# Patient Record
Sex: Male | Born: 1940 | Race: White | Hispanic: No | Smoking: Former smoker
Health system: Southern US, Community
[De-identification: ages and names within clinical notes are randomized; demographics above are authoritative.]

## PROBLEM LIST (undated history)

## (undated) DIAGNOSIS — K5790 Diverticulosis of intestine, part unspecified, without perforation or abscess without bleeding: Secondary | ICD-10-CM

## (undated) DIAGNOSIS — R350 Frequency of micturition: Secondary | ICD-10-CM

## (undated) DIAGNOSIS — F99 Mental disorder, not otherwise specified: Secondary | ICD-10-CM

## (undated) DIAGNOSIS — C801 Malignant (primary) neoplasm, unspecified: Secondary | ICD-10-CM

## (undated) DIAGNOSIS — I10 Essential (primary) hypertension: Secondary | ICD-10-CM

## (undated) DIAGNOSIS — K219 Gastro-esophageal reflux disease without esophagitis: Secondary | ICD-10-CM

## (undated) DIAGNOSIS — C4490 Unspecified malignant neoplasm of skin, unspecified: Secondary | ICD-10-CM

## (undated) HISTORY — PX: OTHER SURGICAL HISTORY: SHX169

---

## 2008-07-04 ENCOUNTER — Encounter: Admission: RE | Admit: 2008-07-04 | Discharge: 2008-07-04 | Payer: Self-pay | Admitting: Gastroenterology

## 2009-01-29 ENCOUNTER — Ambulatory Visit: Payer: Self-pay | Admitting: Diagnostic Radiology

## 2009-01-29 ENCOUNTER — Ambulatory Visit (HOSPITAL_BASED_OUTPATIENT_CLINIC_OR_DEPARTMENT_OTHER): Admission: RE | Admit: 2009-01-29 | Discharge: 2009-01-29 | Payer: Self-pay | Admitting: Family Medicine

## 2012-06-26 ENCOUNTER — Inpatient Hospital Stay (HOSPITAL_COMMUNITY)
Admission: EM | Admit: 2012-06-26 | Discharge: 2012-07-01 | DRG: 419 | Disposition: A | Discharge status: Home / Self Care | Payer: Medicare PPO | Attending: Internal Medicine | Admitting: Internal Medicine

## 2012-06-26 ENCOUNTER — Emergency Department (HOSPITAL_COMMUNITY): Payer: Medicare PPO

## 2012-06-26 ENCOUNTER — Encounter (HOSPITAL_COMMUNITY): Payer: Self-pay | Admitting: Emergency Medicine

## 2012-06-26 DIAGNOSIS — K219 Gastro-esophageal reflux disease without esophagitis: Secondary | ICD-10-CM | POA: Diagnosis present

## 2012-06-26 DIAGNOSIS — R7989 Other specified abnormal findings of blood chemistry: Secondary | ICD-10-CM | POA: Diagnosis present

## 2012-06-26 DIAGNOSIS — I10 Essential (primary) hypertension: Secondary | ICD-10-CM | POA: Diagnosis present

## 2012-06-26 DIAGNOSIS — F1010 Alcohol abuse, uncomplicated: Secondary | ICD-10-CM | POA: Diagnosis present

## 2012-06-26 DIAGNOSIS — K8050 Calculus of bile duct without cholangitis or cholecystitis without obstruction: Secondary | ICD-10-CM | POA: Diagnosis present

## 2012-06-26 DIAGNOSIS — K859 Acute pancreatitis without necrosis or infection, unspecified: Principal | ICD-10-CM

## 2012-06-26 DIAGNOSIS — R1013 Epigastric pain: Secondary | ICD-10-CM | POA: Diagnosis present

## 2012-06-26 DIAGNOSIS — K7010 Alcoholic hepatitis without ascites: Secondary | ICD-10-CM | POA: Diagnosis present

## 2012-06-26 DIAGNOSIS — Z87891 Personal history of nicotine dependence: Secondary | ICD-10-CM

## 2012-06-26 DIAGNOSIS — E860 Dehydration: Secondary | ICD-10-CM

## 2012-06-26 HISTORY — DX: Diverticulosis of intestine, part unspecified, without perforation or abscess without bleeding: K57.90

## 2012-06-26 HISTORY — DX: Essential (primary) hypertension: I10

## 2012-06-26 HISTORY — DX: Gastro-esophageal reflux disease without esophagitis: K21.9

## 2012-06-26 LAB — CBC WITH DIFFERENTIAL/PLATELET
Basophils Absolute: 0.1 K/uL (ref 0.0–0.1)
Basophils Relative: 1 % (ref 0–1)
Eosinophils Absolute: 0.5 K/uL (ref 0.0–0.7)
Eosinophils Relative: 5 % (ref 0–5)
HCT: 43.6 % (ref 39.0–52.0)
Hemoglobin: 15 g/dL (ref 13.0–17.0)
Lymphocytes Relative: 21 % (ref 12–46)
Lymphs Abs: 1.8 10*3/uL (ref 0.7–4.0)
MCH: 30.4 pg (ref 26.0–34.0)
MCHC: 34.4 g/dL (ref 30.0–36.0)
MCV: 88.3 fL (ref 78.0–100.0)
Monocytes Absolute: 0.7 10*3/uL (ref 0.1–1.0)
Monocytes Relative: 7 % (ref 3–12)
Neutro Abs: 5.8 10*3/uL (ref 1.7–7.7)
Neutrophils Relative %: 66 % (ref 43–77)
Platelets: 180 K/uL (ref 150–400)
RBC: 4.94 MIL/uL (ref 4.22–5.81)
RDW: 13.2 % (ref 11.5–15.5)
WBC: 8.8 10*3/uL (ref 4.0–10.5)

## 2012-06-26 LAB — COMPREHENSIVE METABOLIC PANEL WITH GFR
ALT: 33 U/L (ref 0–53)
AST: 71 U/L — ABNORMAL HIGH (ref 0–37)
Albumin: 4.2 g/dL (ref 3.5–5.2)
Calcium: 9.5 mg/dL (ref 8.4–10.5)
GFR calc Af Amer: 73 mL/min — ABNORMAL LOW (ref >90)
Sodium: 135 meq/L (ref 135–145)
Total Protein: 7.1 g/dL (ref 6.0–8.3)

## 2012-06-26 LAB — URINALYSIS, ROUTINE W REFLEX MICROSCOPIC
Bilirubin Urine: NEGATIVE
Glucose, UA: NEGATIVE mg/dL
Hgb urine dipstick: NEGATIVE
Ketones, ur: NEGATIVE mg/dL
Leukocytes, UA: NEGATIVE
Nitrite: NEGATIVE
Protein, ur: NEGATIVE mg/dL
Specific Gravity, Urine: 1.013 (ref 1.005–1.030)
Urobilinogen, UA: 0.2 mg/dL (ref 0.0–1.0)
pH: 8 (ref 5.0–8.0)

## 2012-06-26 LAB — COMPREHENSIVE METABOLIC PANEL
Alkaline Phosphatase: 69 U/L (ref 39–117)
BUN: 25 mg/dL — ABNORMAL HIGH (ref 6–23)
CO2: 26 mEq/L (ref 19–32)
Chloride: 99 mEq/L (ref 96–112)
Creatinine, Ser: 1.14 mg/dL (ref 0.50–1.35)
GFR calc non Af Amer: 63 mL/min — ABNORMAL LOW (ref >90)
Glucose, Bld: 137 mg/dL — ABNORMAL HIGH (ref 70–99)
Potassium: 4.3 mEq/L (ref 3.5–5.1)
Total Bilirubin: 1 mg/dL (ref 0.3–1.2)

## 2012-06-26 LAB — POCT I-STAT TROPONIN I: Troponin i, poc: 0 ng/mL (ref 0.00–0.08)

## 2012-06-26 LAB — CK TOTAL AND CKMB (NOT AT ARMC)
CK, MB: 3.4 ng/mL (ref 0.3–4.0)
Relative Index: INVALID (ref 0.0–2.5)
Total CK: 94 U/L (ref 7–232)

## 2012-06-26 LAB — CBC
HCT: 42.6 % (ref 39.0–52.0)
MCHC: 33.8 g/dL (ref 30.0–36.0)
MCV: 88.6 fL (ref 78.0–100.0)
RDW: 13.3 % (ref 11.5–15.5)

## 2012-06-26 LAB — LIPASE, BLOOD: Lipase: >3000 U/L — ABNORMAL HIGH (ref 11–59)

## 2012-06-26 LAB — CREATININE, SERUM: GFR calc Af Amer: >90 mL/min (ref >90)

## 2012-06-26 MED ORDER — ONDANSETRON HCL 4 MG/2ML IJ SOLN
4.0000 mg | Freq: Once | INTRAMUSCULAR | Status: AC
  Administered 2012-06-26: 4 mg via INTRAVENOUS
  Filled 2012-06-26: qty 2

## 2012-06-26 MED ORDER — HYDROMORPHONE HCL PF 1 MG/ML IJ SOLN: 0.5000 mg | INTRAMUSCULAR | Status: DC | PRN

## 2012-06-26 MED ORDER — ONDANSETRON HCL 4 MG/2ML IJ SOLN: 4.0000 mg | Freq: Four times a day (QID) | INTRAMUSCULAR | Status: DC | PRN

## 2012-06-26 MED ORDER — PANTOPRAZOLE SODIUM 40 MG PO TBEC
40.0000 mg | DELAYED_RELEASE_TABLET | Freq: Every day | ORAL | Status: DC
  Administered 2012-06-26 – 2012-07-01 (×6): 40 mg via ORAL
  Filled 2012-06-26 (×6): qty 1

## 2012-06-26 MED ORDER — HYDROMORPHONE HCL PF 1 MG/ML IJ SOLN
1.0000 mg | Freq: Once | INTRAMUSCULAR | Status: AC
  Administered 2012-06-26: 1 mg via INTRAVENOUS
  Filled 2012-06-26: qty 1

## 2012-06-26 MED ORDER — ONDANSETRON HCL 4 MG PO TABS: 4.0000 mg | ORAL_TABLET | Freq: Four times a day (QID) | ORAL | Status: DC | PRN

## 2012-06-26 MED ORDER — POLYETHYLENE GLYCOL 3350 17 G PO PACK
17.0000 g | PACK | Freq: Every day | ORAL | Status: DC | PRN
  Administered 2012-06-26 – 2012-06-27 (×2): 17 g via ORAL
  Filled 2012-06-26 (×2): qty 1

## 2012-06-26 MED ORDER — SODIUM CHLORIDE 0.9 % IV SOLN
Freq: Once | INTRAVENOUS | Status: AC
  Administered 2012-06-26: 12:00:00 via INTRAVENOUS

## 2012-06-26 MED ORDER — THIAMINE HCL 100 MG/ML IJ SOLN
Freq: Once | INTRAVENOUS | Status: AC
  Administered 2012-06-26: 18:00:00 via INTRAVENOUS
  Filled 2012-06-26: qty 1000

## 2012-06-26 MED ORDER — AMLODIPINE BESYLATE 5 MG PO TABS
5.0000 mg | ORAL_TABLET | Freq: Every day | ORAL | Status: DC
  Administered 2012-06-26 – 2012-06-29 (×4): 5 mg via ORAL
  Filled 2012-06-26 (×4): qty 1

## 2012-06-26 MED ORDER — ONDANSETRON HCL 4 MG/2ML IJ SOLN: 4.0000 mg | Freq: Three times a day (TID) | INTRAMUSCULAR | Status: DC | PRN

## 2012-06-26 MED ORDER — HYDROMORPHONE HCL PF 1 MG/ML IJ SOLN: 1.0000 mg | INTRAMUSCULAR | Status: DC | PRN

## 2012-06-26 MED ORDER — FINASTERIDE 5 MG PO TABS
5.0000 mg | ORAL_TABLET | Freq: Every day | ORAL | Status: DC
  Administered 2012-06-26 – 2012-07-01 (×6): 5 mg via ORAL
  Filled 2012-06-26 (×6): qty 1

## 2012-06-26 MED ORDER — HEPARIN SODIUM (PORCINE) 5000 UNIT/ML IJ SOLN
5000.0000 [IU] | Freq: Three times a day (TID) | INTRAMUSCULAR | Status: DC
  Administered 2012-06-26 – 2012-06-29 (×10): 5000 [IU] via SUBCUTANEOUS
  Filled 2012-06-26 (×14): qty 1

## 2012-06-26 MED ORDER — SODIUM CHLORIDE 0.9 % IV SOLN
INTRAVENOUS | Status: AC
  Administered 2012-06-26 – 2012-06-27 (×2): via INTRAVENOUS

## 2012-06-26 NOTE — ED Provider Notes (Signed)
History     CSN: [id number]  Arrival date & time 06/26/12  5284   First MD Initiated Contact with Patient 06/26/12 1033      Chief Complaint  Patient presents with  . Abdominal Pain    (Consider location/radiation/quality/duration/timing/severity/associated sxs/prior treatment) HPI Comments: Pt with rather sudden onset of mid upper epigastric pain earlier this AM, has been present for few hours, not changing much.  Cannot reproduce it.  Some nausea, doesn't radiate to chest, flanks, back.  Pt did not yet eat breakfast this AM, had ribs for dinner in early evening yesterday, did not have alcohol yesterday.  He denies h/o CAD, no prior abd surgeries in the past.  Denies diarrhea, black stools.  He takes omeprazole for GERD, but this feels much worse and different.  Has not taken anything specific for the pain, waited about 30 min, wasn't easing so came to the ED.    Patient is a 71 y.o. male presenting with abdominal pain. The history is provided by the patient.  Abdominal Pain The primary symptoms of the illness include abdominal pain. The primary symptoms of the illness do not include fever, nausea, vomiting or diarrhea.  Symptoms associated with the illness do not include chills, constipation or back pain.    Past Medical History  Diagnosis Date  . Hypertension   . Acid reflux     History reviewed. No pertinent past surgical history.  History reviewed. No pertinent family history.  History  Substance Use Topics  . Smoking status: Former Games developer  . Smokeless tobacco: Not on file  . Alcohol Use: Yes      Review of Systems  Constitutional: Negative for fever and chills.  Cardiovascular: Negative for chest pain.  Gastrointestinal: Positive for abdominal pain. Negative for nausea, vomiting, diarrhea, constipation and blood in stool.  Musculoskeletal: Negative for back pain.  Neurological: Negative for dizziness and light-headedness.  All other systems reviewed and are  negative.    Allergies  Review of patient's allergies indicates no known allergies.  Home Medications   Current Outpatient Rx  Name Route Sig Dispense Refill  . AMLODIPINE BESYLATE 5 MG PO TABS Oral Take 5 mg by mouth daily.    Marland Kitchen DARIFENACIN HYDROBROMIDE ER 7.5 MG PO TB24 Oral Take 7.5 mg by mouth daily.    Marland Kitchen FINASTERIDE 5 MG PO TABS Oral Take 5 mg by mouth daily.    Marland Kitchen GLUCOSAMINE-CHONDROITIN 500-400 MG PO TABS Oral Take 1 tablet by mouth 2 (two) times daily.    Marland Kitchen LOSARTAN POTASSIUM 50 MG PO TABS Oral Take 50 mg by mouth daily.    . ADULT MULTIVITAMIN W/MINERALS CH Oral Take 1 tablet by mouth daily.    Marland Kitchen OMEPRAZOLE 40 MG PO CPDR Oral Take 40 mg by mouth daily.      BP 131/67  Pulse 58  Temp 98 F (36.7 C) (Oral)  Resp 16  SpO2 99%  Physical Exam  Nursing note and vitals reviewed. Constitutional: He is oriented to person, place, and time. He appears well-developed and well-nourished.  Non-toxic appearance. He does not have a sickly appearance. He appears ill. No distress.  HENT:  Head: Normocephalic and atraumatic.  Eyes: Pupils are equal, round, and reactive to light. No scleral icterus.  Neck: Normal range of motion. Neck supple.  Cardiovascular: Regular rhythm.   No murmur heard. Pulmonary/Chest: Effort normal and breath sounds normal. No respiratory distress.  Abdominal: Soft. He exhibits no distension. There is tenderness. There is no rebound and  no guarding.  Neurological: He is alert and oriented to person, place, and time.  Skin: Skin is warm. No rash noted. He is diaphoretic. No pallor.    ED Course  Procedures (including critical care time)  Labs Reviewed  URINALYSIS, ROUTINE W REFLEX MICROSCOPIC - Abnormal; Notable for the following:    APPearance CLOUDY (*)     All other components within normal limits  COMPREHENSIVE METABOLIC PANEL - Abnormal; Notable for the following:    Glucose, Bld 137 (*)     BUN 25 (*)     AST 71 (*)  HEMOLYSIS AT THIS LEVEL MAY  AFFECT RESULT   GFR calc non Af Amer 63 (*)     GFR calc Af Amer 73 (*)     All other components within normal limits  LIPASE, BLOOD - Abnormal; Notable for the following:    Lipase >3000 (*)     All other components within normal limits  CBC WITH DIFFERENTIAL  CK TOTAL AND CKMB  POCT I-STAT TROPONIN I   US Abdomen Complete  06/26/2012  *RADIOLOGY REPORT*  Clinical Data:  Abdominal pain.  ABDOMINAL ULTRASOUND COMPLETE  Comparison:  None.  Findings:  Gallbladder:  Layering sludge seen within the gallbladder, however no discrete gallstones identified.  No evidence of gallbladder wall thickening or pericholecystic fluid.  Common Bile Duct:  Upper limits of normal measuring 6 mm in diameter.  Liver: No focal mass lesion identified.  Within normal limits in parenchymal echogenicity.  IVC:  Appears normal.  Pancreas:  No abnormality identified.  Spleen:  Within normal limits in size and echotexture.  Right kidney:  Normal in size and parenchymal echogenicity.  No evidence of mass or hydronephrosis.  Left kidney:  Normal in size and parenchymal echogenicity.  No evidence of mass or hydronephrosis.Tiny subcapsular cyst seen in the mid pole measuring 1.2 cm.  Abdominal Aorta:  No aneurysm identified.  IMPRESSION:  1.  Gallbladder sludge, withoutevidence of discrete gallstones, gallbladder wall thickening, or biliary dilatation. 2.  Tiny left renal cyst.  No evidence of hydronephrosis.  Original Report Authenticated By: Danae Orleans, M.D.   I reviewed images myself.    1. Pancreatitis, acute   2. Dehydration     12:44 PM Pt feels somewhat improved, spoke to [facility] who will admit to hospital.    MDM  Pt is bradycardic, may be vagal response, or due to his norvasc medication.  Not hypotensive.  Labs and slight elevation in LFT's suggests gallstone pancreatitis.  Sludge present on U/S.  Pt denies alcohol use yesterday.  Given age, pt's morbity and mortality is slightly higher, would recommend admission,  esp if surgical treatment ends up being indicated.          Gavin Pound. Jaeven Wanzer, MD 06/26/12 1247

## 2012-06-26 NOTE — Progress Notes (Signed)
Dustin Kelly [id number] Admission Data: 06/26/2012 4:49 PM Attending Provider: Marinda Elk, MD  WUJ:[id number], Dustin Bussing, MD Consults/ Treatment Team:    Dustin Kelly is a 71 y.o. male patient admitted from ED with pancreatitis awake, alert  & orientated  X 3,  Full Code, VSS - Blood pressure 132/80, pulse 65, temperature 98.7 F (37.1 C), temperature source Oral, resp. rate 16, height 5\' 9"  (1.753 m), weight 90.8 kg (200 lb 2.8 oz), SpO2 96.00%.,  no c/o shortness of breath, no c/o chest pain, no distress noted.   IV site WDL: right ac, condition patent and no redness with a transparent dsg that's clean dry and intact.  Allergies:  No Known Allergies   Past Medical History  Diagnosis Date  . Hypertension   . Acid reflux   . Diverticulosis     History:  obtained from the patient. Tobacco/alcohol: Quit tobacco use 43 years ago 3 liquor drinks per day(s)  Pt orientation to unit, room and routine. Information packet given to patient/family and safety video watched.  Admission INP armband ID verified with patient/family, and in place. SR up x 2, fall risk assessment complete with Patient and family verbalizing understanding of risks associated with falls. Pt verbalizes an understanding of how to use the call bell and to call for help before getting out of bed.  Skin, clean-dry- intact without evidence of bruising, or skin tears.   No evidence of skin break down noted on exam.     Will cont to monitor and assist as needed.  Cindra Eves, RN 06/26/2012 4:49 PM

## 2012-06-26 NOTE — ED Notes (Signed)
Patient transported to Ultrasound 
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
## 2012-06-26 NOTE — ED Notes (Signed)
Pt. Stated, it came in really hard with abd. Pain, no other symptoms

## 2012-06-27 DIAGNOSIS — R52 Pain, unspecified: Secondary | ICD-10-CM

## 2012-06-27 DIAGNOSIS — K811 Chronic cholecystitis: Secondary | ICD-10-CM

## 2012-06-27 DIAGNOSIS — I10 Essential (primary) hypertension: Secondary | ICD-10-CM

## 2012-06-27 LAB — COMPREHENSIVE METABOLIC PANEL
ALT: 174 U/L — ABNORMAL HIGH (ref 0–53)
Albumin: 3.4 g/dL — ABNORMAL LOW (ref 3.5–5.2)
Alkaline Phosphatase: 77 U/L (ref 39–117)
BUN: 25 mg/dL — ABNORMAL HIGH (ref 6–23)
Chloride: 105 mEq/L (ref 96–112)
Glucose, Bld: 94 mg/dL (ref 70–99)
Potassium: 3.5 mEq/L (ref 3.5–5.1)
Total Bilirubin: 1.8 mg/dL — ABNORMAL HIGH (ref 0.3–1.2)

## 2012-06-27 LAB — CBC
MCHC: 33.5 g/dL (ref 30.0–36.0)
MCV: 88.6 fL (ref 78.0–100.0)
Platelets: 150 10*3/uL (ref 150–400)
RDW: 13.5 % (ref 11.5–15.5)
WBC: 9.1 10*3/uL (ref 4.0–10.5)

## 2012-06-27 MED ORDER — SODIUM CHLORIDE 0.9 % IV SOLN
INTRAVENOUS | Status: AC
  Administered 2012-06-27: 12:00:00 via INTRAVENOUS

## 2012-06-27 MED ORDER — BISACODYL 10 MG RE SUPP
10.0000 mg | Freq: Once | RECTAL | Status: AC
  Administered 2012-06-27: 10 mg via RECTAL
  Filled 2012-06-27: qty 1

## 2012-06-27 NOTE — Consult Note (Signed)
I have seen and examined patient.  He does drink alcohol but has sludge in gallbladder concerning for source of pancreatitis.  He is better this am with mild epigastric tenderness.  I recommended lap chole with ioc when pancreatitis resolves prior to discharge. We discussed indication to be prevention of further episodes and doing while here as even in short term he can have another more significant episode.  He is not sure he wants to do this but we will follow daily and continue to recommend.

## 2012-06-27 NOTE — Progress Notes (Signed)
[facility] PROGRESS NOTE  Dustin Kelly ZOX:[id number] DOB: 13-Sep-1941 DOA: 06/26/2012   Assessment/Plan: Patient Active Hospital Problem List: Epigastric abdominal pain (06/26/2012) -patient relates his abdominal pain is improved. Today his LFTs are much higher than yesterday including his bilirubin. There is no further 1 split on his LFTs. On ultrasound the Common Bile Duct: Upper limits of normal measuring 6 mm. -We'll go ahead and consult surgery.   HTN (hypertension) (06/26/2012) -good control, continue amlodipine.  ETOH abuse (06/26/2012) -CIWA score is very low continue monitor no signs of withdrawal.  Elevated LFTs (06/26/2012) -see epigastric abdominal pain   Code Status: Full code  Family Communication: Patient  Disposition Plan: To be determined     LOS: 1 day   Procedures: U/s: Gallbladder sludge, withoutevidence of discrete gallstones, gallbladder wall thickening, or biliary dilatation.    Antibiotics:  none    Subjective: The patient relates his abdominal pain is much better. He did tolerate ice chips overnight.  Objective: Filed Vitals:   06/26/12 1230 06/26/12 1500 06/26/12 2143 06/27/12 0601  BP: 131/67 132/80 175/77 127/64  Pulse: 58 65 61 67  Temp:  98.7 F (37.1 C) 97.7 F (36.5 C) 99.5 F (37.5 C)  TempSrc:  Oral Axillary Oral  Resp:  16 18 17   Height:  5\' 9"  (1.753 m)    Weight:  90.8 kg (200 lb 2.8 oz)  92.851 kg (204 lb 11.2 oz)  SpO2: 99% 96% 95% 96%    Intake/Output Summary (Last 24 hours) at 06/27/12 0817 Last data filed at 06/26/12 1831  Gross per 24 hour  Intake 348.33 ml  Output      0 ml  Net 348.33 ml   Weight change:   Exam:  General: Alert, awake, oriented x3 HEENT: No Jaundice.  Heart: Regular rate and rhythm, without murmurs, rubs, gallops.  Lungs: Good air movement, bilateral air movement.  Abdomen: Soft, nontender, nondistended, positive bowel sounds. Murphy sign negative    Data Reviewed: Basic  Metabolic Panel:  Lab 06/27/12 8119 06/26/12 1546 06/26/12 1029  NA 140 -- 135  K 3.5 -- 4.3  CL 105 -- 99  CO2 26 -- 26  GLUCOSE 94 -- 137*  BUN 25* -- 25*  CREATININE 0.87 0.92 1.14  CALCIUM 8.6 -- 9.5  MG -- -- --  PHOS -- -- --   Liver Function Tests:  Lab 06/27/12 0627 06/26/12 1029  AST 127* 71*  ALT 174* 33  ALKPHOS 77 69  BILITOT 1.8* 1.0  PROT 5.6* 7.1  ALBUMIN 3.4* 4.2    Lab 06/26/12 1059  LIPASE >3000*  AMYLASE --   No results found for this basename: AMMONIA:5 in the last 168 hours CBC:  Lab 06/27/12 0627 06/26/12 1546 06/26/12 1029  WBC 9.1 12.5* 8.8  NEUTROABS -- -- 5.8  HGB 12.7* 14.4 15.0  HCT 37.9* 42.6 43.6  MCV 88.6 88.6 88.3  PLT 150 171 180   Cardiac Enzymes:  Lab 06/26/12 1031  CKTOTAL 94  CKMB 3.4  CKMBINDEX --  TROPONINI --   BNP: No components found with this basename: POCBNP:5 CBG: No results found for this basename: GLUCAP:5 in the last 168 hours  No results found for this or any previous visit (from the past 240 hour(s)).   Studies: US Abdomen Complete  06/26/2012  *RADIOLOGY REPORT*  Clinical Data:  Abdominal pain.  ABDOMINAL ULTRASOUND COMPLETE  Comparison:  None.  Findings:  Gallbladder:  Layering sludge seen within the gallbladder, however no discrete gallstones identified.  No evidence of gallbladder wall thickening or pericholecystic fluid.  Common Bile Duct:  Upper limits of normal measuring 6 mm in diameter.  Liver: No focal mass lesion identified.  Within normal limits in parenchymal echogenicity.  IVC:  Appears normal.  Pancreas:  No abnormality identified.  Spleen:  Within normal limits in size and echotexture.  Right kidney:  Normal in size and parenchymal echogenicity.  No evidence of mass or hydronephrosis.  Left kidney:  Normal in size and parenchymal echogenicity.  No evidence of mass or hydronephrosis.Tiny subcapsular cyst seen in the mid pole measuring 1.2 cm.  Abdominal Aorta:  No aneurysm identified.   IMPRESSION:  1.  Gallbladder sludge, withoutevidence of discrete gallstones, gallbladder wall thickening, or biliary dilatation. 2.  Tiny left renal cyst.  No evidence of hydronephrosis.  Original Report Authenticated By: Danae Orleans, M.D.    Scheduled Meds:   . sodium chloride   Intravenous Once  . sodium chloride   Intravenous STAT  . sodium chloride   Intravenous STAT  . amLODipine  5 mg Oral Daily  . finasteride  5 mg Oral Daily  . heparin  5,000 Units Subcutaneous Q8H  . HYDROmorphone  1 mg Intravenous Once  . ondansetron  4 mg Intravenous Once  . pantoprazole  40 mg Oral Q1200  . general admission iv infusion   Intravenous Once   Continuous Infusions:   Lambert Keto, MD  [facility] Pager [phone]  If 7PM-7AM, please contact night-coverage [email or web address] Password [facility] 06/27/2012, 8:17 AM

## 2012-06-27 NOTE — Care Management Note (Signed)
    Page 1 of 1   07/01/2012     3:34:16 PM   CARE MANAGEMENT NOTE 07/01/2012  Patient:  Dustin Kelly,Dustin Kelly   Account Number:  [id number]  Date Initiated:  06/27/2012  Documentation initiated by:  Letha Cape  Subjective/Objective Assessment:   dx abd pain, pancreatitis  admit- lives alone.     Action/Plan:   ? lap chole  7/18- lap chole   Anticipated DC Date:  07/01/2012   Anticipated DC Plan:  HOME W HOME HEALTH SERVICES      DC Planning Services  CM consult      Choice offered to / List presented to:             Status of service:  Completed, signed off Medicare Important Message given?   (If response is "NO", the following Medicare IM given date fields will be blank) Date Medicare IM given:   Date Additional Medicare IM given:    Discharge Disposition:  HOME/SELF CARE  Per UR Regulation:  Reviewed for med. necessity/level of care/duration of stay  If discussed at Long Length of Stay Meetings, dates discussed:    Comments:  07/01/12 15:33 Letha Cape RN, BSN [phone] patient dc to home today, no needs identified.  06/30/12 12:47 Letha Cape RN, BSN [phone] patient for surgery today, - lap chole.  NCM will continue to follow for dc needs.  06/27/12 16:54 Letha Cape RN, BSN [phone] patient lives alone, pta independent.  Patient has gall bladder pancreatitis, ? for surgery.  NCM will continue to follow for dc needs.

## 2012-06-27 NOTE — Consult Note (Signed)
Reason for Consult:Abdominal pain Referring Physician: Dr. Lenise Arena. Dustin Kelly is an 71 y.o. male.  HPI: This is a 71 year old male with past medical history of hypertension who also drinks every day 3-4 shots of whiskey.  When he doesn't do whiskey he does wine or beer. Comes in for sudden onset of abdominal pain on the morning of admission. He relates this but it out as a 6/10 and went up to a 10 out of 10 in 3-4 hours. No nausea/emesis. Denied binge drinking prior to [employer].     Past Medical History  Diagnosis Date  . Hypertension   . Acid reflux   . Diverticulosis     History reviewed. No pertinent past surgical history.  History reviewed. No pertinent family history.  Social History:  reports that he has quit smoking. His smoking use included Cigarettes. He has a 1.25 pack-year smoking history. He does not have any smokeless tobacco history on file. He reports that he drinks about 2.4 ounces of alcohol per week. He reports that he does not use illicit drugs.  Allergies: No Known Allergies  Medications: I have reviewed the patient's current medications.  Results for orders placed during the hospital encounter of 06/26/12 (from the past 48 hour(s))  URINALYSIS, ROUTINE W REFLEX MICROSCOPIC     Status: Abnormal   Collection Time   06/26/12 10:24 AM      Component Value Range Comment   Color, Urine YELLOW  YELLOW    APPearance CLOUDY (*) CLEAR    Specific Gravity, Urine 1.013  1.005 - 1.030    pH 8.0  5.0 - 8.0    Glucose, UA NEGATIVE  NEGATIVE mg/dL    Hgb urine dipstick NEGATIVE  NEGATIVE    Bilirubin Urine NEGATIVE  NEGATIVE    Ketones, ur NEGATIVE  NEGATIVE mg/dL    Protein, ur NEGATIVE  NEGATIVE mg/dL    Urobilinogen, UA 0.2  0.0 - 1.0 mg/dL    Nitrite NEGATIVE  NEGATIVE    Leukocytes, UA NEGATIVE  NEGATIVE MICROSCOPIC NOT DONE ON URINES WITH NEGATIVE PROTEIN, BLOOD, LEUKOCYTES, NITRITE, OR GLUCOSE <1000 mg/dL.  CBC WITH DIFFERENTIAL     Status: Normal   Collection Time   06/26/12 10:29 AM      Component Value Range Comment   WBC 8.8  4.0 - 10.5 K/uL    RBC 4.94  4.22 - 5.81 MIL/uL    Hemoglobin 15.0  13.0 - 17.0 g/dL    HCT 40.9  81.1 - 91.4 %    MCV 88.3  78.0 - 100.0 fL    MCH 30.4  26.0 - 34.0 pg    MCHC 34.4  30.0 - 36.0 g/dL    RDW 78.2  95.6 - 21.3 %    Platelets 180  150 - 400 K/uL    Neutrophils Relative 66  43 - 77 %    Neutro Abs 5.8  1.7 - 7.7 K/uL    Lymphocytes Relative 21  12 - 46 %    Lymphs Abs 1.8  0.7 - 4.0 K/uL    Monocytes Relative 7  3 - 12 %    Monocytes Absolute 0.7  0.1 - 1.0 K/uL    Eosinophils Relative 5  0 - 5 %    Eosinophils Absolute 0.5  0.0 - 0.7 K/uL    Basophils Relative 1  0 - 1 %    Basophils Absolute 0.1  0.0 - 0.1 K/uL   COMPREHENSIVE METABOLIC PANEL  Status: Abnormal   Collection Time   06/26/12 10:29 AM      Component Value Range Comment   Sodium 135  135 - 145 mEq/L    Potassium 4.3  3.5 - 5.1 mEq/L    Chloride 99  96 - 112 mEq/L    CO2 26  19 - 32 mEq/L    Glucose, Bld 137 (*) 70 - 99 mg/dL    BUN 25 (*) 6 - 23 mg/dL    Creatinine, Ser 6.57  0.50 - 1.35 mg/dL    Calcium 9.5  8.4 - 84.6 mg/dL    Total Protein 7.1  6.0 - 8.3 g/dL    Albumin 4.2  3.5 - 5.2 g/dL    AST 71 (*) 0 - 37 U/L HEMOLYSIS AT THIS LEVEL MAY AFFECT RESULT   ALT 33  0 - 53 U/L    Alkaline Phosphatase 69  39 - 117 U/L    Total Bilirubin 1.0  0.3 - 1.2 mg/dL    GFR calc non Af Amer 63 (*) >90 mL/min    GFR calc Af Amer 73 (*) >90 mL/min   CK TOTAL AND CKMB     Status: Normal   Collection Time   06/26/12 10:31 AM      Component Value Range Comment   Total CK 94  7 - 232 U/L    CK, MB 3.4  0.3 - 4.0 ng/mL    Relative Index RELATIVE INDEX IS INVALID  0.0 - 2.5   POCT I-STAT TROPONIN I     Status: Normal   Collection Time   06/26/12 10:37 AM      Component Value Range Comment   Troponin i, poc 0.00  0.00 - 0.08 ng/mL    Comment 3            LIPASE, BLOOD     Status: Abnormal   Collection Time   06/26/12 10:59  AM      Component Value Range Comment   Lipase >3000 (*) 11 - 59 U/L   CBC     Status: Abnormal   Collection Time   06/26/12  3:46 PM      Component Value Range Comment   WBC 12.5 (*) 4.0 - 10.5 K/uL    RBC 4.81  4.22 - 5.81 MIL/uL    Hemoglobin 14.4  13.0 - 17.0 g/dL    HCT 96.2  95.2 - 84.1 %    MCV 88.6  78.0 - 100.0 fL    MCH 29.9  26.0 - 34.0 pg    MCHC 33.8  30.0 - 36.0 g/dL    RDW 32.4  40.1 - 02.7 %    Platelets 171  150 - 400 K/uL   CREATININE, SERUM     Status: Abnormal   Collection Time   06/26/12  3:46 PM      Component Value Range Comment   Creatinine, Ser 0.92  0.50 - 1.35 mg/dL    GFR calc non Af Amer 83 (*) >90 mL/min    GFR calc Af Amer >90  >90 mL/min   TSH     Status: Normal   Collection Time   06/26/12  3:46 PM      Component Value Range Comment   TSH 0.655  0.350 - 4.500 uIU/mL   COMPREHENSIVE METABOLIC PANEL     Status: Abnormal   Collection Time   06/27/12  6:27 AM      Component Value Range Comment   Sodium 140  135 - 145 mEq/L    Potassium 3.5  3.5 - 5.1 mEq/L    Chloride 105  96 - 112 mEq/L    CO2 26  19 - 32 mEq/L    Glucose, Bld 94  70 - 99 mg/dL    BUN 25 (*) 6 - 23 mg/dL    Creatinine, Ser 1.61  0.50 - 1.35 mg/dL    Calcium 8.6  8.4 - 09.6 mg/dL    Total Protein 5.6 (*) 6.0 - 8.3 g/dL    Albumin 3.4 (*) 3.5 - 5.2 g/dL    AST 045 (*) 0 - 37 U/L    ALT 174 (*) 0 - 53 U/L    Alkaline Phosphatase 77  39 - 117 U/L    Total Bilirubin 1.8 (*) 0.3 - 1.2 mg/dL    GFR calc non Af Amer 85 (*) >90 mL/min    GFR calc Af Amer >90  >90 mL/min   CBC     Status: Abnormal   Collection Time   06/27/12  6:27 AM      Component Value Range Comment   WBC 9.1  4.0 - 10.5 K/uL    RBC 4.28  4.22 - 5.81 MIL/uL    Hemoglobin 12.7 (*) 13.0 - 17.0 g/dL    HCT 40.9 (*) 81.1 - 52.0 %    MCV 88.6  78.0 - 100.0 fL    MCH 29.7  26.0 - 34.0 pg    MCHC 33.5  30.0 - 36.0 g/dL    RDW 91.4  78.2 - 95.6 %    Platelets 150  150 - 400 K/uL   PROTIME-INR     Status: Normal     Collection Time   06/27/12  6:27 AM      Component Value Range Comment   Prothrombin Time 14.9  11.6 - 15.2 seconds    INR 1.15  0.00 - 1.49     US Abdomen Complete  06/26/2012  *RADIOLOGY REPORT*  Clinical Data:  Abdominal pain.  ABDOMINAL ULTRASOUND COMPLETE  Comparison:  None.  Findings:  Gallbladder:  Layering sludge seen within the gallbladder, however no discrete gallstones identified.  No evidence of gallbladder wall thickening or pericholecystic fluid.  Common Bile Duct:  Upper limits of normal measuring 6 mm in diameter.  Liver: No focal mass lesion identified.  Within normal limits in parenchymal echogenicity.  IVC:  Appears normal.  Pancreas:  No abnormality identified.  Spleen:  Within normal limits in size and echotexture.  Right kidney:  Normal in size and parenchymal echogenicity.  No evidence of mass or hydronephrosis.  Left kidney:  Normal in size and parenchymal echogenicity.  No evidence of mass or hydronephrosis.Tiny subcapsular cyst seen in the mid pole measuring 1.2 cm.  Abdominal Aorta:  No aneurysm identified.  IMPRESSION:  1.  Gallbladder sludge, withoutevidence of discrete gallstones, gallbladder wall thickening, or biliary dilatation. 2.  Tiny left renal cyst.  No evidence of hydronephrosis.  Original Report Authenticated By: Danae Orleans, M.D.    Review of Systems  Constitutional: Negative.   HENT: Negative.   Eyes: Negative.   Respiratory: Negative.   Cardiovascular: Negative.   Gastrointestinal: Positive for abdominal pain. Negative for heartburn, nausea, vomiting, diarrhea, constipation, blood in stool and melena.  Genitourinary: Negative.   Musculoskeletal: Negative.   Skin: Negative.   Neurological: Negative.   Endo/Heme/Allergies: Negative.   Psychiatric/Behavioral: Negative.    Blood pressure 127/64, pulse 67, temperature 99.5 F (37.5 C), temperature source Oral,  resp. rate 17, height 5\' 9"  (1.753 m), weight 204 lb 11.2 oz (92.851 kg), SpO2  96.00%. Physical Exam  Constitutional: He is oriented to person, place, and time. He appears well-developed and well-nourished.  HENT:  Head: Normocephalic and atraumatic.  Mouth/Throat: No oropharyngeal exudate.  Eyes: Conjunctivae are normal. Pupils are equal, round, and reactive to light. Right eye exhibits no discharge. Left eye exhibits no discharge. No scleral icterus.  Neck: Normal range of motion. Neck supple. No JVD present. No tracheal deviation present. No thyromegaly present.  Cardiovascular: Normal rate and regular rhythm.  Exam reveals no gallop and no friction rub.   No murmur heard. Respiratory: Effort normal and breath sounds normal. No stridor. No respiratory distress. He has no wheezes. He has no rales. He exhibits no tenderness.  GI: Soft. Bowel sounds are normal. He exhibits no distension and no mass. There is tenderness. There is no rebound and no guarding.  Musculoskeletal: Normal range of motion. He exhibits no edema and no tenderness.  Lymphadenopathy:    He has no cervical adenopathy.  Neurological: He is alert and oriented to person, place, and time.  Skin: Skin is warm and dry. No rash noted. No erythema. No pallor.  Psychiatric: He has a normal mood and affect.    Assessment/Plan: 1. Cholelithiasis vs pancreatitis (Gallbladder: Layering sludge seen within the gallbladder, however  no discrete gallstones identified. No evidence of gallbladder wall  thickening or pericholecystic fluid.  Common Bile Duct: Upper limits of normal measuring 6 mm in  diameter.  Liver: No focal mass lesion identified. Within normal limits in  parenchymal echogenicity). 2. Remain NPO 3. Continue to monitor WBC, LFT's  4. Probable cholysestectomy in the next few days once LFTs are wnl.  Laure Leone 06/27/2012, 10:04 AM

## 2012-06-28 DIAGNOSIS — K859 Acute pancreatitis without necrosis or infection, unspecified: Principal | ICD-10-CM

## 2012-06-28 LAB — COMPREHENSIVE METABOLIC PANEL
AST: 54 U/L — ABNORMAL HIGH (ref 0–37)
Albumin: 3.7 g/dL (ref 3.5–5.2)
Alkaline Phosphatase: 79 U/L (ref 39–117)
BUN: 16 mg/dL (ref 6–23)
Calcium: 9.1 mg/dL (ref 8.4–10.5)
Creatinine, Ser: 0.76 mg/dL (ref 0.50–1.35)
Glucose, Bld: 92 mg/dL (ref 70–99)
Sodium: 141 mEq/L (ref 135–145)
Total Bilirubin: 2.8 mg/dL — ABNORMAL HIGH (ref 0.3–1.2)

## 2012-06-28 LAB — CBC
HCT: 40.6 % (ref 39.0–52.0)
MCH: 29.8 pg (ref 26.0–34.0)
MCHC: 33.7 g/dL (ref 30.0–36.0)
MCV: 88.5 fL (ref 78.0–100.0)
RDW: 13.4 % (ref 11.5–15.5)

## 2012-06-28 MED ORDER — POTASSIUM CHLORIDE 10 MEQ/100ML IV SOLN
10.0000 meq | INTRAVENOUS | Status: AC
  Administered 2012-06-28 (×2): 10 meq via INTRAVENOUS
  Filled 2012-06-28 (×2): qty 100

## 2012-06-28 MED ORDER — POTASSIUM CHLORIDE 10 MEQ/100ML IV SOLN
10.0000 meq | INTRAVENOUS | Status: AC
  Administered 2012-06-28: 10 meq via INTRAVENOUS
  Filled 2012-06-28 (×3): qty 100

## 2012-06-28 MED ORDER — SODIUM CHLORIDE 0.9 % IV SOLN
INTRAVENOUS | Status: DC
  Administered 2012-06-28 (×2): via INTRAVENOUS

## 2012-06-28 NOTE — Consult Note (Signed)
Referring Provider: Dr. Marinda Elk Primary Care Physician:  Sid Falcon, MD Primary Gastroenterologist:  Dr. Evette Cristal  Reason for Consultation:  Pancreatitis,? CBD stone  HPI: Dustin Kelly is a 71 y.o. male admitted to the hospital 2 days ago following the abrupt onset of severe epigastric pain, not associated with fevers, nausea, vomiting, or radiation to the back. Evaluation in the emergency room showed a lipase over 3000, an ultrasound showed a 6 mm duct with sludge in the gallbladder. He had never had any previous similar attacks. The patient drinks regularly and fairly heavily, probably 6 ounces of whiskey a day, but there have been no binge drinking prior to this episode, nor any prior history of pancreatitis. Since admission, the patient has had a rapid decline in his lipase level and is essentially pain-free at this time. He is not eating, but he is tolerating clear liquids without any nausea, vomiting, or exacerbation of symptoms. His liver chemistries were mildly elevated on admission, with transaminases in the 100-200 range, total bilirubin 1.8. Since admission, the transaminases have fallen about 50% but the bilirubin has risen from 1.8 to 2.8, so we were asked to see the patient with respect to possible need for ERCP.   Past Medical History  Diagnosis Date  . Hypertension   . Acid reflux   . Diverticulosis     History reviewed. No pertinent past surgical history.  Prior to Admission medications   Medication Sig Start Date End Date Taking? Authorizing Provider  amLODipine (NORVASC) 5 MG tablet Take 5 mg by mouth daily.   Yes Historical Provider, MD  darifenacin (ENABLEX) 7.5 MG 24 hr tablet Take 7.5 mg by mouth daily.   Yes Historical Provider, MD  finasteride (PROSCAR) 5 MG tablet Take 5 mg by mouth daily.   Yes Historical Provider, MD  glucosamine-chondroitin 500-400 MG tablet Take 1 tablet by mouth 2 (two) times daily.   Yes Historical Provider, MD  losartan  (COZAAR) 50 MG tablet Take 50 mg by mouth daily.   Yes Historical Provider, MD  Multiple Vitamin (MULTIVITAMIN WITH MINERALS) TABS Take 1 tablet by mouth daily.   Yes Historical Provider, MD  omeprazole (PRILOSEC) 40 MG capsule Take 40 mg by mouth daily.   Yes Historical Provider, MD    Current Facility-Administered Medications  Medication Dose Route Frequency Provider Last Rate Last Dose  . 0.9 %  sodium chloride infusion   Intravenous STAT Rolan Lipa, NP 100 mL/hr at 06/27/12 1148    . 0.9 %  sodium chloride infusion   Intravenous Continuous Marinda Elk, MD 10 mL/hr at 06/28/12 1049    . amLODipine (NORVASC) tablet 5 mg  5 mg Oral Daily Marinda Elk, MD   5 mg at 06/28/12 0907  . bisacodyl (DULCOLAX) suppository 10 mg  10 mg Rectal Once Caroline More, NP   10 mg at 06/27/12 2107  . finasteride (PROSCAR) tablet 5 mg  5 mg Oral Daily Marinda Elk, MD   5 mg at 06/28/12 0907  . heparin injection 5,000 Units  5,000 Units Subcutaneous Q8H Marinda Elk, MD   5,000 Units at 06/28/12 1322  . HYDROmorphone (DILAUDID) injection 1 mg  1 mg Intravenous Q2H PRN Marinda Elk, MD      . ondansetron [facility]) tablet 4 mg  4 mg Oral Q6H PRN Marinda Elk, MD       Or  . ondansetron [facility]) injection 4 mg  4 mg Intravenous Q6H PRN Darin Engels  David Stall, MD      . pantoprazole (PROTONIX) EC tablet 40 mg  40 mg Oral Q1200 Marinda Elk, MD   40 mg at 06/28/12 1100  . polyethylene glycol (MIRALAX / GLYCOLAX) packet 17 g  17 g Oral Daily PRN Marinda Elk, MD   17 g at 06/27/12 1700  . potassium chloride 10 mEq in 100 mL IVPB  10 mEq Intravenous Q1 Hr x 3 Marinda Elk, MD   10 mEq at 06/28/12 1049  . potassium chloride 10 mEq in 100 mL IVPB  10 mEq Intravenous Q1 Hr x 2 Marinda Elk, MD   10 mEq at 06/28/12 1257    Allergies as of 06/26/2012  . (No Known Allergies)    History reviewed. No pertinent family history.  History    Social History  . Marital Status: Divorced    Spouse Name: N/A    Number of Children: N/A  . Years of Education: N/A   Occupational History  . Retired Arboriculturist   Social History Main Topics  . Smoking status: Former Smoker -- 0.2 packs/day for 5 years    Types: Cigarettes  . Smokeless tobacco: Not on file  . Alcohol Use: 2.4 oz/week    4 Shots of liquor per week  . Drug Use: No  . Sexually Active: No   Other Topics Concern  . Not on file   Social History Narrative  . No narrative on file    Review of Systems:  See history of present illness  Physical Exam: Vital signs in last 24 hours: Temp:  [97.9 F (36.6 C)-100.4 F (38 C)] 97.9 F (36.6 C) (07/16 1323) Pulse Rate:  [63-76] 67  (07/16 1323) Resp:  [18-20] 18  (07/16 1323) BP: (158-189)/(80-88) 160/80 mmHg (07/16 1323) SpO2:  [93 %-97 %] 97 % (07/16 1323) Weight:  [90.8 kg (200 lb 2.8 oz)] 90.8 kg (200 lb 2.8 oz) (07/16 0536) Last BM Date: 06/28/12 General:   Alert,  Well-developed, well-nourished, pleasant and cooperative in NAD Head:  Normocephalic and atraumatic. Eyes:  Sclera clear, no icterus.   Lungs:  Clear throughout to auscultation.   No wheezes, crackles, or rhonchi. No evident respiratory distress. Heart:   Regular rate and rhythm; no murmurs, clicks, rubs,  or gallops. Abdomen:  Soft, nontender, nontympanitic, and nondistended. No masses, hepatosplenomegaly or ventral hernias noted. Normal bowel sounds, without bruits, guarding, or rebound.   Msk:   Symmetrical without gross deformities. Extremities:   Without clubbing, cyanosis. Neurologic:  Alert and coherent;  grossly normal neurologically. Psych:   Alert and cooperative. Normal mood and affect.  Intake/Output from previous day: 07/15 0701 - 07/16 0700 In: 1633.3 [I.V.:1633.3] Out: -  Intake/Output this shift:    Lab Results:  Basename 06/28/12 0557 06/27/12 0627 06/26/12 1546  WBC 8.0 9.1 12.5*  HGB 13.7 12.7* 14.4  HCT  40.6 37.9* 42.6  PLT 160 150 171   BMET  Basename 06/28/12 0557 06/27/12 0627 06/26/12 1546 06/26/12 1029  NA 141 140 -- 135  K 3.3* 3.5 -- 4.3  CL 105 105 -- 99  CO2 22 26 -- 26  GLUCOSE 92 94 -- 137*  BUN 16 25* -- 25*  CREATININE 0.76 0.87 0.92 --  CALCIUM 9.1 8.6 -- 9.5   LFT  Basename 06/28/12 0557  PROT 6.6  ALBUMIN 3.7  AST 54*  ALT 113*  ALKPHOS 79  BILITOT 2.8*  BILIDIR --  IBILI --   PT/INR  Basename 06/27/12 0627  LABPROT 14.9  INR 1.15    Studies/Results: No results found.  Impression: Rapidly resolving pancreatitis. Given the elevated liver chemistries, the rapid resolution of pancreatitis, and the patient's overall clinical picture, I think this is most likely biliary pancreatitis and I agree with the surgeons plan for cholecystectomy this admission. I doubt this pancreatitis is due to alcohol because there is been no recent increase in his alcohol consumption, and in my experience, one does not usually see such a high elevation of lipase, plus a rapid normalization, as we have seen in this patient, when the pancreatitis is due to alcohol. Pancreatitis due to other causes, such as malignancy or hypertriglyceridemia, also seems unlikely.  Plan: Await labs tomorrow. I will check a direct bilirubin to see if the mild elevation of bilirubin we're seeing could be due to Gilbert's disease. I will also check a triglyceride level. If his bilirubin continues to rise but his other numbers look good, we might consider an MRCP in preference to an ERCP, to reduce risk. On the other hand, if his bilirubin is improved tomorrow, I would agree with Dr. Doreen Salvage plan to proceed to cholecystectomy.   In case ERCP is needed at some point, I have reviewed the nature, purpose, and risks of the procedure with the patient. Blind passage of the scope into the patient's esophagus would have to be done carefully because of his history of a Zenker's diverticulum, although his most  recent barium swallow on the record, performed 4 years ago, showed only a tiny recurrent diverticulum..   LOS: 2 days   Zasha Belleau V  06/28/2012, 2:46 PM

## 2012-06-28 NOTE — Progress Notes (Signed)
Feels well, nontender abdomen, lipase returning to normal, I think pancreatitis has resolved but bilirubin up now, would ask gi to see.  If bilirubin is rising tomorrow then I think ercp would be initial test.  If coming down then will plan lap chole tomorrow, will make npo after mn

## 2012-06-28 NOTE — Progress Notes (Signed)
  Subjective: Feels good this morning, no c/o n/v, +BM this am +BS "Would like something to eat."  Objective: Vital signs in last 24 hours: Temp:  [98.8 F (37.1 C)-100.4 F (38 C)] 99.1 F (37.3 C) (07/16 0536) Pulse Rate:  [63-76] 76  (07/16 0536) Resp:  [18-20] 18  (07/16 0536) BP: (158-189)/(76-88) 163/86 mmHg (07/16 0536) SpO2:  [93 %-96 %] 93 % (07/16 0536) Weight:  [200 lb 2.8 oz (90.8 kg)] 200 lb 2.8 oz (90.8 kg) (07/16 0536) Last BM Date: 06/28/12  Intake/Output from previous day: 07/15 0701 - 07/16 0700 In: 1633.3 [I.V.:1633.3] Out: -  Intake/Output this shift:    General appearance: alert, cooperative, appears stated age and no distress Abdomen: No bloating, +BS, + BM   Lipase now at 241 (was > 3000) all other labs trending towards normal. Remains afebrile.  Lab Results:   Basename 06/28/12 0557 06/27/12 0627  WBC 8.0 9.1  HGB 13.7 12.7*  HCT 40.6 37.9*  PLT 160 150   BMET  Basename 06/28/12 0557 06/27/12 0627  NA 141 140  K 3.3* 3.5  CL 105 105  CO2 22 26  GLUCOSE 92 94  BUN 16 25*  CREATININE 0.76 0.87  CALCIUM 9.1 8.6   PT/INR  Basename 06/27/12 0627  LABPROT 14.9  INR 1.15   ABG No results found for this basename: PHART:2,PCO2:2,PO2:2,HCO3:2 in the last 72 hours  Studies/Results: US Abdomen Complete  06/26/2012  *RADIOLOGY REPORT*  Clinical Data:  Abdominal pain.  ABDOMINAL ULTRASOUND COMPLETE  Comparison:  None.  Findings:  Gallbladder:  Layering sludge seen within the gallbladder, however no discrete gallstones identified.  No evidence of gallbladder wall thickening or pericholecystic fluid.  Common Bile Duct:  Upper limits of normal measuring 6 mm in diameter.  Liver: No focal mass lesion identified.  Within normal limits in parenchymal echogenicity.  IVC:  Appears normal.  Pancreas:  No abnormality identified.  Spleen:  Within normal limits in size and echotexture.  Right kidney:  Normal in size and parenchymal echogenicity.  No evidence  of mass or hydronephrosis.  Left kidney:  Normal in size and parenchymal echogenicity.  No evidence of mass or hydronephrosis.Tiny subcapsular cyst seen in the mid pole measuring 1.2 cm.  Abdominal Aorta:  No aneurysm identified.  IMPRESSION:  1.  Gallbladder sludge, withoutevidence of discrete gallstones, gallbladder wall thickening, or biliary dilatation. 2.  Tiny left renal cyst.  No evidence of hydronephrosis.  Original Report Authenticated By: Danae Orleans, M.D.    Anti-infectives: Anti-infectives    None      Assessment/Plan: s/p * No surgery found * 1. Advance to clear liquid diet. 2. Possible lap chole, soon; will await Dr. Doreen Salvage discussion with patient concerning this issue.  LOS: 2 days    Dustin Kelly 06/28/2012

## 2012-06-28 NOTE — Progress Notes (Signed)
[facility] PROGRESS NOTE  Dustin Kelly YNW:[id number] DOB: 05/18/1941 DOA: 06/26/2012   Assessment/Plan: Patient Active Hospital Problem List: Epigastric abdominal pain (06/26/2012) -patient relates his abdominal pain is improved. Today his LFTs are trending down but his bilirubin is trending up. We'll go ahead and get a GI consult for further evaluation to -Appriciate surgery assistance laparoscopic cholecystectomy soon.   HTN (hypertension) (06/26/2012) -good control, continue amlodipine.  ETOH abuse (06/26/2012) -CIWA score is very low continue monitor no signs of withdrawal.  Elevated LFTs (06/26/2012) -see epigastric abdominal pain   Code Status: Full code  Family Communication: Patient  Disposition Plan: To be determined     LOS: 2 days   Procedures: U/s: Gallbladder sludge, withoutevidence of discrete gallstones, gallbladder wall thickening, or biliary dilatation.    Antibiotics:  none    Subjective: The patient relates his abdominal pain is much better. He did tolerate ice chips overnight.  Objective: Filed Vitals:   06/27/12 1300 06/27/12 2058 06/27/12 2224 06/28/12 0536  BP: 158/76 189/80 158/88 163/86  Pulse: 65 63  76  Temp: 98.8 F (37.1 C) 100.4 F (38 C)  99.1 F (37.3 C)  TempSrc: Oral Oral  Oral  Resp: 18 20  18   Height:      Weight:    90.8 kg (200 lb 2.8 oz)  SpO2: 93% 96%  93%    Intake/Output Summary (Last 24 hours) at 06/28/12 1016 Last data filed at 06/27/12 1828  Gross per 24 hour  Intake 1633.33 ml  Output      0 ml  Net 1633.33 ml   Weight change: 0 kg (0 lb)  Exam:  General: Alert, awake, oriented x3 HEENT: No Jaundice.  Heart: Regular rate and rhythm, without murmurs, rubs, gallops.  Lungs: Good air movement, bilateral air movement.  Abdomen: Soft, nontender, nondistended, positive bowel sounds. Murphy sign negative    Data Reviewed: Basic Metabolic Panel:  Lab 06/28/12 6578 06/27/12 0627 06/26/12 1546  06/26/12 1029  NA 141 140 -- 135  K 3.3* 3.5 -- --  CL 105 105 -- 99  CO2 22 26 -- 26  GLUCOSE 92 94 -- 137*  BUN 16 25* -- 25*  CREATININE 0.76 0.87 0.92 1.14  CALCIUM 9.1 8.6 -- 9.5  MG -- -- -- --  PHOS -- -- -- --   Liver Function Tests:  Lab 06/28/12 0557 06/27/12 0627 06/26/12 1029  AST 54* 127* 71*  ALT 113* 174* 33  ALKPHOS 79 77 69  BILITOT 2.8* 1.8* 1.0  PROT 6.6 5.6* 7.1  ALBUMIN 3.7 3.4* 4.2    Lab 06/28/12 0557 06/26/12 1059  LIPASE 241* >3000*  AMYLASE -- --   No results found for this basename: AMMONIA:5 in the last 168 hours CBC:  Lab 06/28/12 0557 06/27/12 0627 06/26/12 1546 06/26/12 1029  WBC 8.0 9.1 12.5* 8.8  NEUTROABS -- -- -- 5.8  HGB 13.7 12.7* 14.4 15.0  HCT 40.6 37.9* 42.6 43.6  MCV 88.5 88.6 88.6 88.3  PLT 160 150 171 180   Cardiac Enzymes:  Lab 06/26/12 1031  CKTOTAL 94  CKMB 3.4  CKMBINDEX --  TROPONINI --   BNP: No components found with this basename: POCBNP:5 CBG: No results found for this basename: GLUCAP:5 in the last 168 hours  No results found for this or any previous visit (from the past 240 hour(s)).   Studies: US Abdomen Complete  06/26/2012  *RADIOLOGY REPORT*  Clinical Data:  Abdominal pain.  ABDOMINAL ULTRASOUND COMPLETE  Comparison:  None.  Findings:  Gallbladder:  Layering sludge seen within the gallbladder, however no discrete gallstones identified.  No evidence of gallbladder wall thickening or pericholecystic fluid.  Common Bile Duct:  Upper limits of normal measuring 6 mm in diameter.  Liver: No focal mass lesion identified.  Within normal limits in parenchymal echogenicity.  IVC:  Appears normal.  Pancreas:  No abnormality identified.  Spleen:  Within normal limits in size and echotexture.  Right kidney:  Normal in size and parenchymal echogenicity.  No evidence of mass or hydronephrosis.  Left kidney:  Normal in size and parenchymal echogenicity.  No evidence of mass or hydronephrosis.Tiny subcapsular cyst seen in  the mid pole measuring 1.2 cm.  Abdominal Aorta:  No aneurysm identified.  IMPRESSION:  1.  Gallbladder sludge, withoutevidence of discrete gallstones, gallbladder wall thickening, or biliary dilatation. 2.  Tiny left renal cyst.  No evidence of hydronephrosis.  Original Report Authenticated By: Danae Orleans, M.D.    Scheduled Meds:    . sodium chloride   Intravenous STAT  . sodium chloride   Intravenous STAT  . amLODipine  5 mg Oral Daily  . bisacodyl  10 mg Rectal Once  . finasteride  5 mg Oral Daily  . heparin  5,000 Units Subcutaneous Q8H  . pantoprazole  40 mg Oral Q1200  . potassium chloride  10 mEq Intravenous Q1 Hr x 3   Continuous Infusions:    . sodium chloride 75 mL/hr at 06/28/12 0544    Lambert Keto, MD  [facility] Pager [phone]  If 7PM-7AM, please contact night-coverage [email or web address] Password [email or web address] 06/28/2012, 10:16 AM

## 2012-06-28 NOTE — Progress Notes (Signed)
PT had an elevated bp of 169/93. RN notified MD on call. RN will continue to monitor pt

## 2012-06-29 LAB — CBC WITH DIFFERENTIAL/PLATELET
HCT: 37.9 % — ABNORMAL LOW (ref 39.0–52.0)
Hemoglobin: 13 g/dL (ref 13.0–17.0)
Lymphs Abs: 1.9 10*3/uL (ref 0.7–4.0)
MCH: 30.4 pg (ref 26.0–34.0)
Monocytes Relative: 11 % (ref 3–12)
Neutro Abs: 4.3 10*3/uL (ref 1.7–7.7)
Neutrophils Relative %: 58 % (ref 43–77)
RBC: 4.27 MIL/uL (ref 4.22–5.81)

## 2012-06-29 LAB — COMPREHENSIVE METABOLIC PANEL
ALT: 74 U/L — ABNORMAL HIGH (ref 0–53)
Albumin: 3.5 g/dL (ref 3.5–5.2)
Calcium: 9.2 mg/dL (ref 8.4–10.5)
GFR calc Af Amer: >90 mL/min (ref >90)
Glucose, Bld: 102 mg/dL — ABNORMAL HIGH (ref 70–99)
Sodium: 140 mEq/L (ref 135–145)
Total Protein: 6.4 g/dL (ref 6.0–8.3)

## 2012-06-29 LAB — TRIGLYCERIDES: Triglycerides: 76 mg/dL (ref <150)

## 2012-06-29 LAB — SURGICAL PCR SCREEN: MRSA, PCR: NEGATIVE

## 2012-06-29 MED ORDER — AMLODIPINE BESYLATE 10 MG PO TABS
10.0000 mg | ORAL_TABLET | Freq: Every day | ORAL | Status: DC
  Administered 2012-06-30 – 2012-07-01 (×2): 10 mg via ORAL
  Filled 2012-06-29 (×2): qty 1

## 2012-06-29 MED ORDER — POTASSIUM CHLORIDE 10 MEQ/100ML IV SOLN
10.0000 meq | INTRAVENOUS | Status: AC
  Administered 2012-06-29: 10 meq via INTRAVENOUS
  Filled 2012-06-29 (×2): qty 100

## 2012-06-29 MED ORDER — MUPIROCIN 2 % EX OINT
1.0000 "application " | TOPICAL_OINTMENT | Freq: Two times a day (BID) | CUTANEOUS | Status: DC
  Administered 2012-06-29 – 2012-06-30 (×4): 1 via NASAL
  Filled 2012-06-29: qty 22

## 2012-06-29 MED ORDER — CHLORHEXIDINE GLUCONATE CLOTH 2 % EX PADS
6.0000 | MEDICATED_PAD | Freq: Every day | CUTANEOUS | Status: DC
  Administered 2012-06-29 – 2012-06-30 (×2): 6 via TOPICAL

## 2012-06-29 NOTE — Progress Notes (Signed)
PATIENT DETAILS Name: Dustin Kelly Age: 71 y.o. Sex: male Date of Birth: September 25, 1941 Admit Date: 06/26/2012 [id number], Nolon Bussing, MD  Subjective: Feels good-no abd pain-cholecystectomy postponed for 7/18  Objective: Vital signs in last 24 hours: Filed Vitals:   06/28/12 2131 06/28/12 2252 06/29/12 0500 06/29/12 0537  BP: 169/93 161/90  154/84  Pulse: 73   70  Temp: 100.8 F (38.2 C)   98.5 F (36.9 C)  TempSrc: Oral   Oral  Resp: 18   18  Height:      Weight:   89.1 kg (196 lb 6.9 oz)   SpO2: 96%   96%    Assessment/Plan: Principal Problem:  *Abdominal pain, acute, epigastric -2/2 Gallstone pancreatitis -Lap Cholecystectomy tomorrow  Active Problems:  HTN (hypertension) -moderate control with amlodipine-will increase to 10 mg/day -resume Losartan at discharge   ETOH abuse -awake and alert-no tremors -monitor   Elevated LFTs -2/2 Gallstone pancreatitis and ? ETOH hepatitis-now downtrending -monitor LFT's  GERD -PPI  Disposition: -remain inpatient-home after Lap Cholecystectomy  DVT Prophylaxis: -SQ Heparin  Code Status: Full Code   Intake/Output from previous day:  Intake/Output Summary (Last 24 hours) at 06/29/12 1021 Last data filed at 06/29/12 0600  Gross per 24 hour  Intake 813.08 ml  Output      0 ml  Net 813.08 ml    PHYSICAL EXAM: Weight change: -1.7 kg (-3 lb 12 oz) Body mass index is 29.01 kg/(m^2).  Gen Exam: Awake and alert with clear speech.  Neck: Supple, No JVD.   Chest: B/L Clear.   CVS: S1 S2 Regular, no murmurs.  Abdomen: soft, BS +, non tender, non distended.  Extremities: no edema, lower extremities warm to touch. Neurologic: Non Focal.   Skin: No Rash.   Wounds: N/A.    CONSULTS:  GI and general surgery  LAB RESULTS: CBC  Lab 06/29/12 0616 06/28/12 0557 06/27/12 0627 06/26/12 1546 06/26/12 1029  WBC 7.4 8.0 9.1 12.5* 8.8  HGB 13.0 13.7 12.7* 14.4 15.0  HCT 37.9* 40.6 37.9* 42.6 43.6  PLT 153 160 150 171  180  MCV 88.8 88.5 88.6 88.6 88.3  MCH 30.4 29.8 29.7 29.9 30.4  MCHC 34.3 33.7 33.5 33.8 34.4  RDW 13.3 13.4 13.5 13.3 13.2  LYMPHSABS 1.9 -- -- -- 1.8  MONOABS 0.8 -- -- -- 0.7  EOSABS 0.3 -- -- -- 0.5  BASOSABS 0.0 -- -- -- 0.1  BANDABS -- -- -- -- --    Chemistries   Lab 06/29/12 0616 06/28/12 0557 06/27/12 0627 06/26/12 1546 06/26/12 1029  NA 140 141 140 -- 135  K 3.1* 3.3* 3.5 -- 4.3  CL 105 105 105 -- 99  CO2 26 22 26  -- 26  GLUCOSE 102* 92 94 -- 137*  BUN 10 16 25* -- 25*  CREATININE 0.85 0.76 0.87 0.92 1.14  CALCIUM 9.2 9.1 8.6 -- 9.5  MG -- -- -- -- --    CBG: No results found for this basename: GLUCAP:5 in the last 168 hours  GFR Estimated Creatinine Clearance: 89.3 ml/min (by C-G formula based on Cr of 0.85).  Coagulation profile  Lab 06/27/12 0627  INR 1.15  PROTIME --    Cardiac Enzymes  Lab 06/26/12 1031  CKMB 3.4  TROPONINI --  MYOGLOBIN --    No components found with this basename: POCBNP:3 No results found for this basename: DDIMER:2 in the last 72 hours No results found for this basename: HGBA1C:2 in the last 72 hours  Basename 06/29/12 0616  CHOL --  HDL --  LDLCALC --  TRIG 76  CHOLHDL --  LDLDIRECT --    Basename 06/26/12 1546  TSH 0.655  T4TOTAL --  T3FREE --  THYROIDAB --   No results found for this basename: VITAMINB12:2,FOLATE:2,FERRITIN:2,TIBC:2,IRON:2,RETICCTPCT:2 in the last 72 hours  Basename 06/29/12 0616 06/28/12 0557  LIPASE 124* 241*  AMYLASE -- --    Urine Studies No results found for this basename: UACOL:2,UAPR:2,USPG:2,UPH:2,UTP:2,UGL:2,UKET:2,UBIL:2,UHGB:2,UNIT:2,UROB:2,ULEU:2,UEPI:2,UWBC:2,URBC:2,UBAC:2,CAST:2,CRYS:2,UCOM:2,BILUA:2 in the last 72 hours  MICROBIOLOGY: Recent Results (from the past 240 hour(s))  SURGICAL PCR SCREEN     Status: Abnormal   Collection Time   06/29/12  6:25 AM      Component Value Range Status Comment   MRSA, PCR NEGATIVE  NEGATIVE Final    Staphylococcus aureus  POSITIVE (*) NEGATIVE Final     RADIOLOGY STUDIES/RESULTS: US Abdomen Complete  06/26/2012  *RADIOLOGY REPORT*  Clinical Data:  Abdominal pain.  ABDOMINAL ULTRASOUND COMPLETE  Comparison:  None.  Findings:  Gallbladder:  Layering sludge seen within the gallbladder, however no discrete gallstones identified.  No evidence of gallbladder wall thickening or pericholecystic fluid.  Common Bile Duct:  Upper limits of normal measuring 6 mm in diameter.  Liver: No focal mass lesion identified.  Within normal limits in parenchymal echogenicity.  IVC:  Appears normal.  Pancreas:  No abnormality identified.  Spleen:  Within normal limits in size and echotexture.  Right kidney:  Normal in size and parenchymal echogenicity.  No evidence of mass or hydronephrosis.  Left kidney:  Normal in size and parenchymal echogenicity.  No evidence of mass or hydronephrosis.Tiny subcapsular cyst seen in the mid pole measuring 1.2 cm.  Abdominal Aorta:  No aneurysm identified.  IMPRESSION:  1.  Gallbladder sludge, withoutevidence of discrete gallstones, gallbladder wall thickening, or biliary dilatation. 2.  Tiny left renal cyst.  No evidence of hydronephrosis.  Original Report Authenticated By: Danae Orleans, M.D.    MEDICATIONS: Scheduled Meds:   . amLODipine  5 mg Oral Daily  . finasteride  5 mg Oral Daily  . heparin  5,000 Units Subcutaneous Q8H  . pantoprazole  40 mg Oral Q1200  . potassium chloride  10 mEq Intravenous Q1 Hr x 3  . potassium chloride  10 mEq Intravenous Q1 Hr x 2  . potassium chloride  10 mEq Intravenous Q1 Hr x 2   Continuous Infusions:   . sodium chloride 20 mL/hr at 06/29/12 0600   PRN Meds:.HYDROmorphone (DILAUDID) injection, ondansetron (ZOFRAN) IV, ondansetron, polyethylene glycol  Antibiotics: Anti-infectives    None       Jeoffrey Massed, MD  [facility] Pager [phone]  If 7PM-7AM, please contact night-coverage [email or web address] Password [email or web address] 06/29/2012, 10:21  AM   LOS: 3 days

## 2012-06-29 NOTE — Progress Notes (Signed)
  Subjective: Feels fine, hungry  Objective: Vital signs in last 24 hours: Temp:  [97.9 F (36.6 C)-100.8 F (38.2 C)] 98.5 F (36.9 C) (07/17 0537) Pulse Rate:  [67-73] 70  (07/17 0537) Resp:  [18] 18  (07/17 0537) BP: (154-169)/(80-93) 154/84 mmHg (07/17 0537) SpO2:  [96 %-97 %] 96 % (07/17 0537) Weight:  [196 lb 6.9 oz (89.1 kg)] 196 lb 6.9 oz (89.1 kg) (07/17 0500) Last BM Date: 06/28/12  Intake/Output from previous day: 07/16 0701 - 07/17 0700 In: 813.1 [P.O.:240; I.V.:573.1] Out: -  Intake/Output this shift:    General appearance: no distress GI: nontender  Lab Results:   Basename 06/29/12 0616 06/28/12 0557  WBC 7.4 8.0  HGB 13.0 13.7  HCT 37.9* 40.6  PLT 153 160   BMET  Basename 06/29/12 0616 06/28/12 0557  NA 140 141  K 3.1* 3.3*  CL 105 105  CO2 26 22  GLUCOSE 102* 92  BUN 10 16  CREATININE 0.85 0.76  CALCIUM 9.2 9.1   PT/INR  Basename 06/27/12 0627  LABPROT 14.9  INR 1.15   ABG No results found for this basename: PHART:2,PCO2:2,PO2:2,HCO3:2 in the last 72 hours  Studies/Results: No results found.  Anti-infectives: Anti-infectives    None      Assessment/Plan: GSP  Pancreatitis clinically resolved, bilirubin decreased a little today, I think best plan would be to proceed with lap chole pending schedule for today.  He is agreeable and we discussed cholangiogram and possible postop ercp. I discussed the procedure in detail.  TWe discussed the risks and benefits of a laparoscopic cholecystectomy and possible cholangiogram including, but not limited to bleeding, infection, injury to surrounding structures such as the intestine or liver, bile leak, retained gallstones, need to convert to an open procedure, prolonged diarrhea,  common bile duct injury, and possible need for additional procedures.  The likelihood of improvement in symptoms and return to the patient's normal status is good. We discussed the typical post-operative recovery  course.    LOS: 3 days    [facility] 06/29/2012

## 2012-06-29 NOTE — Progress Notes (Signed)
Surgical reviewed, and discussed case with Dr. Dwain Sarna.  Patient's labs have all improved. Lipase is almost down to normal, transaminases show further improvement, and even his bilirubin is starting to come down slightly.   Of note, his hyperbilirubinemia is primarily unconjugated (that is, his direct bilirubin was less than half of his total bilirubin). This is compatible with Gilbert's syndrome, which I explained to the patient and discussed with Dr. Dwain Sarna.  Based on these findings, I agree with Dr. Doreen Salvage plan to proceed to cholecystectomy, rather than having the patient undergo further preoperative testing of his common bile duct.  I will sign off at this time, but we will remain on standby, so please call us if cholangiographic findings suggest the need for postoperative evaluation such as ERCP.  Florencia Reasons, M.D. [phone]

## 2012-06-29 NOTE — Progress Notes (Signed)
I told patient I have three other emergencies today that will last all day.  We discussed lap chole first thing tomorrow morning.  Will let him have full liquids until mn

## 2012-06-30 ENCOUNTER — Inpatient Hospital Stay (HOSPITAL_COMMUNITY): Payer: Medicare PPO | Admitting: Critical Care Medicine

## 2012-06-30 ENCOUNTER — Encounter (HOSPITAL_COMMUNITY): Payer: Self-pay | Admitting: Critical Care Medicine

## 2012-06-30 ENCOUNTER — Inpatient Hospital Stay (HOSPITAL_COMMUNITY): Payer: Medicare PPO

## 2012-06-30 ENCOUNTER — Encounter (HOSPITAL_COMMUNITY)
Admission: EM | Disposition: A | Discharge status: Home / Self Care | Payer: Self-pay | Source: Home / Self Care | Attending: Internal Medicine

## 2012-06-30 HISTORY — PX: CHOLECYSTECTOMY: SHX55

## 2012-06-30 SURGERY — LAPAROSCOPIC CHOLECYSTECTOMY WITH INTRAOPERATIVE CHOLANGIOGRAM
Anesthesia: General | Site: Abdomen | Wound class: Contaminated

## 2012-06-30 MED ORDER — SODIUM CHLORIDE 0.9 % IR SOLN
Status: DC | PRN
  Administered 2012-06-30 (×2): 1000 mL

## 2012-06-30 MED ORDER — FENTANYL CITRATE 0.05 MG/ML IJ SOLN
INTRAMUSCULAR | Status: DC | PRN
  Administered 2012-06-30: 100 ug via INTRAVENOUS
  Administered 2012-06-30 (×3): 50 ug via INTRAVENOUS

## 2012-06-30 MED ORDER — SODIUM CHLORIDE 0.9 % IV SOLN
INTRAVENOUS | Status: DC
  Administered 2012-06-30: 15:00:00 via INTRAVENOUS

## 2012-06-30 MED ORDER — 0.9 % SODIUM CHLORIDE (POUR BTL) OPTIME
TOPICAL | Status: DC | PRN
  Administered 2012-06-30: 1000 mL

## 2012-06-30 MED ORDER — ONDANSETRON HCL 4 MG/2ML IJ SOLN
INTRAMUSCULAR | Status: DC | PRN
  Administered 2012-06-30: 4 mg via INTRAVENOUS

## 2012-06-30 MED ORDER — SODIUM CHLORIDE 0.9 % IV SOLN
INTRAVENOUS | Status: DC | PRN
  Administered 2012-06-30: 07:00:00

## 2012-06-30 MED ORDER — MIDAZOLAM HCL 5 MG/5ML IJ SOLN
INTRAMUSCULAR | Status: DC | PRN
  Administered 2012-06-30: 2 mg via INTRAVENOUS

## 2012-06-30 MED ORDER — LIDOCAINE HCL (CARDIAC) 20 MG/ML IV SOLN
INTRAVENOUS | Status: DC | PRN
  Administered 2012-06-30: 50 mg via INTRAVENOUS

## 2012-06-30 MED ORDER — PROPOFOL 10 MG/ML IV EMUL
INTRAVENOUS | Status: DC | PRN
  Administered 2012-06-30: 150 mg via INTRAVENOUS

## 2012-06-30 MED ORDER — KETOROLAC TROMETHAMINE 30 MG/ML IJ SOLN
15.0000 mg | Freq: Once | INTRAMUSCULAR | Status: AC | PRN
  Administered 2012-06-30: 30 mg via INTRAVENOUS

## 2012-06-30 MED ORDER — BUPIVACAINE-EPINEPHRINE 0.25% -1:200000 IJ SOLN
INTRAMUSCULAR | Status: DC | PRN
  Administered 2012-06-30: 10 mL

## 2012-06-30 MED ORDER — OXYCODONE HCL 5 MG PO TABS: 5.0000 mg | ORAL_TABLET | ORAL | Status: DC | PRN

## 2012-06-30 MED ORDER — CEFAZOLIN SODIUM-DEXTROSE 2-3 GM-% IV SOLR
INTRAVENOUS | Status: AC
  Filled 2012-06-30: qty 50

## 2012-06-30 MED ORDER — HYDROMORPHONE HCL PF 1 MG/ML IJ SOLN
INTRAMUSCULAR | Status: AC
  Administered 2012-06-30: 0.5 mg
  Administered 2012-06-30: 0.5 mg via INTRAVENOUS
  Filled 2012-06-30: qty 1

## 2012-06-30 MED ORDER — MEPERIDINE HCL 25 MG/ML IJ SOLN: 6.2500 mg | INTRAMUSCULAR | Status: DC | PRN

## 2012-06-30 MED ORDER — DEXAMETHASONE SODIUM PHOSPHATE 4 MG/ML IJ SOLN
INTRAMUSCULAR | Status: DC | PRN
  Administered 2012-06-30: 4 mg via INTRAVENOUS

## 2012-06-30 MED ORDER — ROCURONIUM BROMIDE 100 MG/10ML IV SOLN
INTRAVENOUS | Status: DC | PRN
  Administered 2012-06-30: 50 mg via INTRAVENOUS

## 2012-06-30 MED ORDER — NEOSTIGMINE METHYLSULFATE 1 MG/ML IJ SOLN
INTRAMUSCULAR | Status: DC | PRN
  Administered 2012-06-30: 4 mg via INTRAVENOUS

## 2012-06-30 MED ORDER — EPHEDRINE SULFATE 50 MG/ML IJ SOLN
INTRAMUSCULAR | Status: DC | PRN
  Administered 2012-06-30: 10 mg via INTRAVENOUS

## 2012-06-30 MED ORDER — PROMETHAZINE HCL 25 MG/ML IJ SOLN: 6.2500 mg | INTRAMUSCULAR | Status: DC | PRN

## 2012-06-30 MED ORDER — CEFAZOLIN SODIUM 1-5 GM-% IV SOLN
INTRAVENOUS | Status: DC | PRN
  Administered 2012-06-30: 2 g via INTRAVENOUS

## 2012-06-30 MED ORDER — LACTATED RINGERS IV SOLN
INTRAVENOUS | Status: DC | PRN
  Administered 2012-06-30 (×2): via INTRAVENOUS

## 2012-06-30 MED ORDER — BUPIVACAINE-EPINEPHRINE PF 0.25-1:200000 % IJ SOLN
INTRAMUSCULAR | Status: AC
  Filled 2012-06-30: qty 30

## 2012-06-30 MED ORDER — HEMOSTATIC AGENTS (NO CHARGE) OPTIME
TOPICAL | Status: DC | PRN
  Administered 2012-06-30 (×2): 1 via TOPICAL

## 2012-06-30 MED ORDER — ACETAMINOPHEN 650 MG RE SUPP: 650.0000 mg | Freq: Four times a day (QID) | RECTAL | Status: DC | PRN

## 2012-06-30 MED ORDER — ACETAMINOPHEN 325 MG PO TABS: 650.0000 mg | ORAL_TABLET | Freq: Four times a day (QID) | ORAL | Status: DC | PRN

## 2012-06-30 MED ORDER — GLYCOPYRROLATE 0.2 MG/ML IJ SOLN
INTRAMUSCULAR | Status: DC | PRN
  Administered 2012-06-30: 0.6 mg via INTRAVENOUS

## 2012-06-30 MED ORDER — OXYCODONE-ACETAMINOPHEN 5-325 MG PO TABS
ORAL_TABLET | ORAL | Status: AC
  Filled 2012-06-30: qty 2

## 2012-06-30 SURGICAL SUPPLY — 40 items
APPLIER CLIP 5 13 M/L LIGAMAX5 (MISCELLANEOUS) ×2
BLADE SURG ROTATE 9660 (MISCELLANEOUS) IMPLANT
CANISTER SUCTION 2500CC (MISCELLANEOUS) ×2 IMPLANT
CHLORAPREP W/TINT 26ML (MISCELLANEOUS) ×2 IMPLANT
CLIP APPLIE 5 13 M/L LIGAMAX5 (MISCELLANEOUS) ×1 IMPLANT
CLOTH BEACON ORANGE TIMEOUT ST (SAFETY) ×2 IMPLANT
COVER MAYO STAND STRL (DRAPES) ×2 IMPLANT
COVER SURGICAL LIGHT HANDLE (MISCELLANEOUS) ×2 IMPLANT
DECANTER SPIKE VIAL GLASS SM (MISCELLANEOUS) ×4 IMPLANT
DERMABOND ADVANCED (GAUZE/BANDAGES/DRESSINGS) ×1
DERMABOND ADVANCED .7 DNX12 (GAUZE/BANDAGES/DRESSINGS) ×1 IMPLANT
DRAPE C-ARM 42X72 X-RAY (DRAPES) ×2 IMPLANT
ELECT REM PT RETURN 9FT ADLT (ELECTROSURGICAL) ×2
ELECTRODE REM PT RTRN 9FT ADLT (ELECTROSURGICAL) ×1 IMPLANT
GLOVE BIO SURGEON STRL SZ7 (GLOVE) ×4 IMPLANT
GLOVE BIO SURGEON STRL SZ7.5 (GLOVE) ×2 IMPLANT
GLOVE BIOGEL PI IND STRL 7.0 (GLOVE) ×1 IMPLANT
GLOVE BIOGEL PI IND STRL 7.5 (GLOVE) ×2 IMPLANT
GLOVE BIOGEL PI INDICATOR 7.0 (GLOVE) ×1
GLOVE BIOGEL PI INDICATOR 7.5 (GLOVE) ×2
GOWN STRL NON-REIN LRG LVL3 (GOWN DISPOSABLE) ×6 IMPLANT
HEMOSTAT SNOW SURGICEL 2X4 (HEMOSTASIS) ×4 IMPLANT
KIT BASIN OR (CUSTOM PROCEDURE TRAY) ×2 IMPLANT
KIT ROOM TURNOVER OR (KITS) ×2 IMPLANT
NS IRRIG 1000ML POUR BTL (IV SOLUTION) ×2 IMPLANT
PAD ARMBOARD 7.5X6 YLW CONV (MISCELLANEOUS) ×2 IMPLANT
POUCH SPECIMEN RETRIEVAL 10MM (ENDOMECHANICALS) ×2 IMPLANT
SCISSORS LAP 5X35 DISP (ENDOMECHANICALS) ×2 IMPLANT
SET CHOLANGIOGRAPH 5 50 .035 (SET/KITS/TRAYS/PACK) ×2 IMPLANT
SET IRRIG TUBING LAPAROSCOPIC (IRRIGATION / IRRIGATOR) ×2 IMPLANT
SLEEVE ENDOPATH XCEL 5M (ENDOMECHANICALS) ×4 IMPLANT
SPECIMEN JAR MEDIUM (MISCELLANEOUS) ×2 IMPLANT
SPECIMEN JAR SMALL (MISCELLANEOUS) IMPLANT
SUT MNCRL AB 4-0 PS2 18 (SUTURE) ×2 IMPLANT
SUT VICRYL 0 UR6 27IN ABS (SUTURE) ×2 IMPLANT
TOWEL OR 17X24 6PK STRL BLUE (TOWEL DISPOSABLE) IMPLANT
TOWEL OR 17X26 10 PK STRL BLUE (TOWEL DISPOSABLE) ×2 IMPLANT
TRAY LAPAROSCOPIC (CUSTOM PROCEDURE TRAY) ×2 IMPLANT
TROCAR XCEL BLUNT TIP 100MML (ENDOMECHANICALS) ×2 IMPLANT
TROCAR XCEL NON-BLD 5MMX100MML (ENDOMECHANICALS) ×2 IMPLANT

## 2012-06-30 NOTE — Transfer of Care (Signed)
Immediate Anesthesia Transfer of Care Note  Patient: Dustin Kelly  Procedure(s) Performed: Procedure(s) (LRB): LAPAROSCOPIC CHOLECYSTECTOMY WITH INTRAOPERATIVE CHOLANGIOGRAM (N/A)  Patient Location: PACU  Anesthesia Type: General  Level of Consciousness: awake, alert  and oriented  Airway & Oxygen Therapy: Patient Spontanous Breathing and Patient connected to nasal cannula oxygen  Post-op Assessment: Report given to PACU RN, Post -op Vital signs reviewed and stable and Patient moving all extremities X 4  Post vital signs: Reviewed and stable  Complications: No apparent anesthesia complications

## 2012-06-30 NOTE — Progress Notes (Signed)
PATIENT DETAILS Name: Dustin Kelly Age: 71 y.o. Sex: male Date of Birth: 1941-11-13 Admit Date: 06/26/2012 [id number], Nolon Bussing, MD  Subjective: S/P cholecystectomy-mild pain  Objective: Vital signs in last 24 hours: Filed Vitals:   06/30/12 1054 06/30/12 1057 06/30/12 1127 06/30/12 1317  BP:  144/81 132/83 120/87  Pulse:  88 78 102  Temp: 97.2 F (36.2 C)  98.1 F (36.7 C) 97.4 F (36.3 C)  TempSrc:   Oral Axillary  Resp:  14 15 16   Height:      Weight:      SpO2:  96% 96% 97%    Assessment/Plan: Principal Problem:  *Abdominal pain, acute, epigastric -2/2 Gallstone pancreatitis -S/P Lap Cholecystectomy today-already on clear liquids  Active Problems:  HTN (hypertension) -moderate control with amlodipine-increased to 10 mg/day on 7/18 -resume Losartan at discharge   ETOH abuse -awake and alert-no tremors -monitor   Elevated LFTs -2/2 Gallstone pancreatitis and ? ETOH hepatitis-now downtrending -monitor LFT's  GERD -PPI  Disposition: -remain inpatient-home hopefully on 7/18  DVT Prophylaxis: -SCD's  Code Status: Full Code   Intake/Output from previous day:  Intake/Output Summary (Last 24 hours) at 06/30/12 1324 Last data filed at 06/30/12 1317  Gross per 24 hour  Intake 3280.33 ml  Output     50 ml  Net 3230.33 ml    PHYSICAL EXAM: Weight change: -1 kg (-2 lb 3.3 oz) Body mass index is 28.68 kg/(m^2).  Gen Exam: Awake and alert with clear speech.  Neck: Supple, No JVD.   Chest: B/L Clear.   CVS: S1 S2 Regular, no murmurs.  Abdomen: soft, BS +, non tender, non distended.  Extremities: no edema, lower extremities warm to touch. Neurologic: Non Focal.   Skin: No Rash.   Wounds: N/A.    CONSULTS:  GI and general surgery  LAB RESULTS: CBC  Lab 06/29/12 0616 06/28/12 0557 06/27/12 0627 06/26/12 1546 06/26/12 1029  WBC 7.4 8.0 9.1 12.5* 8.8  HGB 13.0 13.7 12.7* 14.4 15.0  HCT 37.9* 40.6 37.9* 42.6 43.6  PLT 153 160 150 171 180    MCV 88.8 88.5 88.6 88.6 88.3  MCH 30.4 29.8 29.7 29.9 30.4  MCHC 34.3 33.7 33.5 33.8 34.4  RDW 13.3 13.4 13.5 13.3 13.2  LYMPHSABS 1.9 -- -- -- 1.8  MONOABS 0.8 -- -- -- 0.7  EOSABS 0.3 -- -- -- 0.5  BASOSABS 0.0 -- -- -- 0.1  BANDABS -- -- -- -- --    Chemistries   Lab 06/29/12 0616 06/28/12 0557 06/27/12 0627 06/26/12 1546 06/26/12 1029  NA 140 141 140 -- 135  K 3.1* 3.3* 3.5 -- 4.3  CL 105 105 105 -- 99  CO2 26 22 26  -- 26  GLUCOSE 102* 92 94 -- 137*  BUN 10 16 25* -- 25*  CREATININE 0.85 0.76 0.87 0.92 1.14  CALCIUM 9.2 9.1 8.6 -- 9.5  MG -- -- -- -- --    CBG: No results found for this basename: GLUCAP:5 in the last 168 hours  GFR Estimated Creatinine Clearance: 88.9 ml/min (by C-G formula based on Cr of 0.85).  Coagulation profile  Lab 06/27/12 0627  INR 1.15  PROTIME --    Cardiac Enzymes  Lab 06/26/12 1031  CKMB 3.4  TROPONINI --  MYOGLOBIN --    No components found with this basename: POCBNP:3 No results found for this basename: DDIMER:2 in the last 72 hours No results found for this basename: HGBA1C:2 in the last 72 hours  Basename 06/29/12  4010  CHOL --  HDL --  LDLCALC --  TRIG 76  CHOLHDL --  LDLDIRECT --   No results found for this basename: TSH,T4TOTAL,FREET3,T3FREE,THYROIDAB in the last 72 hours No results found for this basename: VITAMINB12:2,FOLATE:2,FERRITIN:2,TIBC:2,IRON:2,RETICCTPCT:2 in the last 72 hours  Basename 06/29/12 0616 06/28/12 0557  LIPASE 124* 241*  AMYLASE -- --    Urine Studies No results found for this basename: UACOL:2,UAPR:2,USPG:2,UPH:2,UTP:2,UGL:2,UKET:2,UBIL:2,UHGB:2,UNIT:2,UROB:2,ULEU:2,UEPI:2,UWBC:2,URBC:2,UBAC:2,CAST:2,CRYS:2,UCOM:2,BILUA:2 in the last 72 hours  MICROBIOLOGY: Recent Results (from the past 240 hour(s))  SURGICAL PCR SCREEN     Status: Abnormal   Collection Time   06/29/12  6:25 AM      Component Value Range Status Comment   MRSA, PCR NEGATIVE  NEGATIVE Final    Staphylococcus  aureus POSITIVE (*) NEGATIVE Final     RADIOLOGY STUDIES/RESULTS: US Abdomen Complete  06/26/2012  *RADIOLOGY REPORT*  Clinical Data:  Abdominal pain.  ABDOMINAL ULTRASOUND COMPLETE  Comparison:  None.  Findings:  Gallbladder:  Layering sludge seen within the gallbladder, however no discrete gallstones identified.  No evidence of gallbladder wall thickening or pericholecystic fluid.  Common Bile Duct:  Upper limits of normal measuring 6 mm in diameter.  Liver: No focal mass lesion identified.  Within normal limits in parenchymal echogenicity.  IVC:  Appears normal.  Pancreas:  No abnormality identified.  Spleen:  Within normal limits in size and echotexture.  Right kidney:  Normal in size and parenchymal echogenicity.  No evidence of mass or hydronephrosis.  Left kidney:  Normal in size and parenchymal echogenicity.  No evidence of mass or hydronephrosis.Tiny subcapsular cyst seen in the mid pole measuring 1.2 cm.  Abdominal Aorta:  No aneurysm identified.  IMPRESSION:  1.  Gallbladder sludge, withoutevidence of discrete gallstones, gallbladder wall thickening, or biliary dilatation. 2.  Tiny left renal cyst.  No evidence of hydronephrosis.  Original Report Authenticated By: Danae Orleans, M.D.    MEDICATIONS: Scheduled Meds:    . amLODipine  10 mg Oral Daily  . Chlorhexidine Gluconate Cloth  6 each Topical Daily  . finasteride  5 mg Oral Daily  . HYDROmorphone      . mupirocin ointment  1 application Nasal BID  . pantoprazole  40 mg Oral Q1200  . DISCONTD: heparin  5,000 Units Subcutaneous Q8H   Continuous Infusions:    . sodium chloride    . DISCONTD: sodium chloride 20 mL/hr at 06/30/12 0631   PRN Meds:.acetaminophen, acetaminophen, HYDROmorphone (DILAUDID) injection, ketorolac, meperidine (DEMEROL) injection, ondansetron (ZOFRAN) IV, ondansetron, oxyCODONE, promethazine, DISCONTD: 0.9 % irrigation (POUR BTL), DISCONTD: bupivacaine-EPINEPHrine, DISCONTD: hemostatic agents, DISCONTD:  Omnipaque 300 mg/mL (50 mL) in 0.9% normal saline (50 mL), DISCONTD: polyethylene glycol, DISCONTD: sodium chloride irrigation  Antibiotics: Anti-infectives    None       Jeoffrey Massed, MD  [facility] Pager [phone]  If 7PM-7AM, please contact night-coverage [email or web address] Password [email or web address] 06/30/2012, 1:24 PM   LOS: 4 days

## 2012-06-30 NOTE — Op Note (Signed)
Preoperative diagnosis: Gallstone pancreatitis Postoperative diagnosis: Same as above, chronic cholecystitis Procedure: Laparoscopic cholecystectomy with cholangiogram Surgeon: Dr. Harden Mo Anesthesia: Gen. Specimens: Gallbladder and contents to pathology Estimated blood loss: 50 cc Complications: None Drains: None Sponge and needle count was correct x2 at operation Disposition to recovery stable  Indications: This is a 71 year old male who has a recent bout of pancreatitis in the hospital and underwent an ultrasound showing cholelithiasis. His pancreatitis has resolved and he and I discussed a laparoscopic cholecystectomy with the risks and benefits prior to his discharge.  Procedure: After informed consent was obtained the patient was taken to the operating room. He was given 2 g of intravenous cefazolin. Sequential compression devices were placed on his legs. He was then placed under general anesthesia without complication. His abdomen was prepped and draped in the standard sterile surgical fashion. A surgical timeout was then performed.  I infiltrated quarter percent Marcaine below his umbilicus. I then made a vertical incision and identified his fascia. This was entered sharply and the peritoneum was entered bluntly. I placed a 0 Vicryl pursestring suture through the fascia. I then inserted a Hassan trocar and insufflated his abdomen to 15 mm mercury pressure. I then inserted 3 further 5 mm trocars in the epigastrium and right upper quadrant. These were done under direct vision without complication. I then addressed his gallbladder. This was noted to be tense. I then decompressed the gallbladder and evacuated  bile so I could grasp it. I then retracted the gallbladder cephalad and lateral. With some difficulty due to a lot of chronic inflammation I dissected his triangle was clearly able to see both the cystic duct and the cystic artery. I then clipped the cystic duct distally. I  introduced a Cook catheter into the abdomen. I made a ductotomy and introduced the catheter into the cystic duct. This was clipped into position. A cholangiogram was then performed showing that I was in the cystic duct, filling of both sides of the liver, no stones in the duct, and filling of the duodenum. I then removed the catheter and clipped the duct. I then divided this. I treated the artery as well as a small branch of the artery a similar fashion. The gallbladder was then removed from the hepatic bed with some difficulty as there was no real plane due to chronic inflammation. Eventually this was removed with no entrance into the gallbladder. This was then placed in an Endo Catch bag and removed from the umbilicus. I then took a fair amount of time obtaining hemostasis in the liver bed. I then placed 2 pieces of Surgicel in the liver bed. Irrigation was performed until this was clear. I then removed the [facility] and trocar tied down my suture. I placed one additional figure-of-eight 0 Vicryl suture and completely obliterated this defect. I then desufflated the abdomen removed all trocars. I then closed the incisions with 4 Monocryl and Dermabond. He tolerated was extubated and transferred to recovery stable.

## 2012-06-30 NOTE — Preoperative (Signed)
Beta Blockers   Reason not to administer Beta Blockers:Not Applicable 

## 2012-06-30 NOTE — Anesthesia Procedure Notes (Signed)
Procedure Name: Intubation Date/Time: 06/30/2012 7:40 AM Performed by: Elon Alas Pre-anesthesia Checklist: Patient identified, Timeout performed, Emergency Drugs available, Suction available and Patient being monitored Patient Re-evaluated:Patient Re-evaluated prior to inductionOxygen Delivery Method: Circle system utilized Preoxygenation: Pre-oxygenation with 100% oxygen Intubation Type: IV induction Ventilation: Mask ventilation without difficulty Laryngoscope Size: Mac and 4 Grade View: Grade I Tube type: Oral Tube size: 7.5 mm Number of attempts: 1 Airway Equipment and Method: Stylet Placement Confirmation: positive ETCO2,  ETT inserted through vocal cords under direct vision and breath sounds checked- equal and bilateral Secured at: 23 cm Tube secured with: Tape Dental Injury: Teeth and Oropharynx as per pre-operative assessment

## 2012-06-30 NOTE — Anesthesia Preprocedure Evaluation (Addendum)
Anesthesia Evaluation  Patient identified by MRN, date of birth, ID band Patient awake    Reviewed: Allergy & Precautions, H&P , NPO status , Patient's Chart, lab work & pertinent test results  Airway Mallampati: II TM Distance: >3 FB Neck ROM: Full    Dental  (+) Dental Advisory Given and Teeth Intact   Pulmonary former smoker,  breath sounds clear to auscultation        Cardiovascular hypertension, Pt. on medications Rhythm:Regular Rate:Normal     Neuro/Psych    GI/Hepatic GERD-  Medicated,(+)     substance abuse  alcohol use, zenkers diverticulum   Endo/Other    Renal/GU      Musculoskeletal   Abdominal   Peds  Hematology   Anesthesia Other Findings   Reproductive/Obstetrics                        Anesthesia Physical Anesthesia Plan  ASA: II  Anesthesia Plan: General   Post-op Pain Management:    Induction: Intravenous  Airway Management Planned: Oral ETT  Additional Equipment:   Intra-op Plan:   Post-operative Plan: Extubation in OR  Informed Consent: I have reviewed the patients History and Physical, chart, labs and discussed the procedure including the risks, benefits and alternatives for the proposed anesthesia with the patient or authorized representative who has indicated his/her understanding and acceptance.   Dental advisory given  Plan Discussed with: CRNA, Anesthesiologist and Surgeon  Anesthesia Plan Comments:         Anesthesia Quick Evaluation

## 2012-06-30 NOTE — Anesthesia Postprocedure Evaluation (Signed)
  Anesthesia Post-op Note  Patient: Dustin Kelly  Procedure(s) Performed: Procedure(s) (LRB): LAPAROSCOPIC CHOLECYSTECTOMY WITH INTRAOPERATIVE CHOLANGIOGRAM (N/A)  Patient Location: PACU  Anesthesia Type: General  Level of Consciousness: awake  Airway and Oxygen Therapy: Patient Spontanous Breathing  Post-op Pain: mild  Post-op Assessment: Post-op Vital signs reviewed  Post-op Vital Signs: stable  Complications: No apparent anesthesia complications

## 2012-07-01 ENCOUNTER — Encounter (HOSPITAL_COMMUNITY): Payer: Self-pay | Admitting: General Surgery

## 2012-07-01 LAB — CBC
Hemoglobin: 11.8 g/dL — ABNORMAL LOW (ref 13.0–17.0)
MCH: 29.6 pg (ref 26.0–34.0)
MCHC: 33.8 g/dL (ref 30.0–36.0)
Platelets: 138 10*3/uL — ABNORMAL LOW (ref 150–400)

## 2012-07-01 MED ORDER — OXYCODONE HCL 5 MG PO TABS: 5.0000 mg | ORAL_TABLET | ORAL | Status: AC | PRN

## 2012-07-01 NOTE — Progress Notes (Signed)
1340 Discharge instructions reviewed with patient and prescription given to patient verbalize and understands . Skin WNL except abdomen with dermabond dsg.

## 2012-07-01 NOTE — Progress Notes (Signed)
Agree with above 

## 2012-07-01 NOTE — Discharge Summary (Signed)
PATIENT DETAILS Name: Dustin Kelly Age: 71 y.o. Sex: male Date of Birth: 09/01/1941 MRN: [id number]. Admit Date: 06/26/2012 Admitting Physician: Marinda Elk, MD NWG:[id number], Nolon Bussing, MD  Recommendations for Outpatient Follow-up:  Follow up with Dr Renaldo Fiddler and PCP as outlined below  PRIMARY DISCHARGE DIAGNOSIS:  Principal Problem:  *Gall Stone Pancreatitis Active Problems:  HTN (hypertension)  ETOH abuse  Elevated LFTs      PAST MEDICAL HISTORY: Past Medical History  Diagnosis Date  . Hypertension   . Acid reflux   . Diverticulosis     DISCHARGE MEDICATIONS: Medication List  As of 07/01/2012 12:31 PM   TAKE these medications         amLODipine 5 MG tablet   Commonly known as: NORVASC   Take 5 mg by mouth daily.      darifenacin 7.5 MG 24 hr tablet   Commonly known as: ENABLEX   Take 7.5 mg by mouth daily.      finasteride 5 MG tablet   Commonly known as: PROSCAR   Take 5 mg by mouth daily.      glucosamine-chondroitin 500-400 MG tablet   Take 1 tablet by mouth 2 (two) times daily.      losartan 50 MG tablet   Commonly known as: COZAAR   Take 50 mg by mouth daily.      multivitamin with minerals Tabs   Take 1 tablet by mouth daily.      omeprazole 40 MG capsule   Commonly known as: PRILOSEC   Take 40 mg by mouth daily.      oxyCODONE 5 MG immediate release tablet   Commonly known as: Oxy IR/ROXICODONE   Take 1 tablet (5 mg total) by mouth every 4 (four) hours as needed.             BRIEF HPI:  See H&P, Labs, Consult and Test reports for all details in brief, 71 year old male with past medical history of hypertension also alcohol use drinks about every day 3-4 shots of whiskey when he doesn't do whiskey he does wine or beer. Comes in for sudden onset of abdominal pain on the morning of admission  CONSULTATIONS:   GI and general surgery  PERTINENT RADIOLOGIC STUDIES: Dg Cholangiogram Operative  06/30/2012  *RADIOLOGY REPORT*   Clinical Data:   Laparoscopic cholecystectomy.  INTRAOPERATIVE CHOLANGIOGRAM  Comparison: 06/26/2012  Findings: Intraoperative spot images show normal caliber biliary system.  No evidence of retained stone or obstruction.  Free passage of contrast into the small bowel noted.  IMPRESSION: No evidence of retained stone or obstruction.  These images were submitted for radiologic interpretation only. Please see the procedural report for the amount of contrast and the fluoroscopy time utilized.  Original Report Authenticated By: Cyndie Chime, M.D.   US Abdomen Complete  06/26/2012  *RADIOLOGY REPORT*  Clinical Data:  Abdominal pain.  ABDOMINAL ULTRASOUND COMPLETE  Comparison:  None.  Findings:  Gallbladder:  Layering sludge seen within the gallbladder, however no discrete gallstones identified.  No evidence of gallbladder wall thickening or pericholecystic fluid.  Common Bile Duct:  Upper limits of normal measuring 6 mm in diameter.  Liver: No focal mass lesion identified.  Within normal limits in parenchymal echogenicity.  IVC:  Appears normal.  Pancreas:  No abnormality identified.  Spleen:  Within normal limits in size and echotexture.  Right kidney:  Normal in size and parenchymal echogenicity.  No evidence of mass or hydronephrosis.  Left kidney:  Normal in size and  parenchymal echogenicity.  No evidence of mass or hydronephrosis.Tiny subcapsular cyst seen in the mid pole measuring 1.2 cm.  Abdominal Aorta:  No aneurysm identified.  IMPRESSION:  1.  Gallbladder sludge, withoutevidence of discrete gallstones, gallbladder wall thickening, or biliary dilatation. 2.  Tiny left renal cyst.  No evidence of hydronephrosis.  Original Report Authenticated By: Danae Orleans, M.D.     PERTINENT LAB RESULTS: CBC:  Basename 07/01/12 0630 06/29/12 0616  WBC 7.4 7.4  HGB 11.8* 13.0  HCT 34.9* 37.9*  PLT 138* 153   CMET CMP     Component Value Date/Time   NA 140 06/29/2012 0616   K 3.1* 06/29/2012 0616   CL  105 06/29/2012 0616   CO2 26 06/29/2012 0616   GLUCOSE 102* 06/29/2012 0616   BUN 10 06/29/2012 0616   CREATININE 0.85 06/29/2012 0616   CALCIUM 9.2 06/29/2012 0616   PROT 6.4 06/29/2012 0616   ALBUMIN 3.5 06/29/2012 0616   AST 29 06/29/2012 0616   ALT 74* 06/29/2012 0616   ALKPHOS 73 06/29/2012 0616   BILITOT 2.1* 06/29/2012 0616   GFRNONAA 86* 06/29/2012 0616   GFRAA >90 06/29/2012 0616    GFR Estimated Creatinine Clearance: 91.4 ml/min (by C-G formula based on Cr of 0.85).  Basename 06/29/12 0616  LIPASE 124*  AMYLASE --   No results found for this basename: CKTOTAL:3,CKMB:3,CKMBINDEX:3,TROPONINI:3 in the last 72 hours No components found with this basename: POCBNP:3 No results found for this basename: DDIMER:2 in the last 72 hours No results found for this basename: HGBA1C:2 in the last 72 hours  Basename 06/29/12 0616  CHOL --  HDL --  LDLCALC --  TRIG 76  CHOLHDL --  LDLDIRECT --   No results found for this basename: TSH,T4TOTAL,FREET3,T3FREE,THYROIDAB in the last 72 hours No results found for this basename: VITAMINB12:2,FOLATE:2,FERRITIN:2,TIBC:2,IRON:2,RETICCTPCT:2 in the last 72 hours Coags: No results found for this basename: PT:2,INR:2 in the last 72 hours Microbiology: Recent Results (from the past 240 hour(s))  SURGICAL PCR SCREEN     Status: Abnormal   Collection Time   06/29/12  6:25 AM      Component Value Range Status Comment   MRSA, PCR NEGATIVE  NEGATIVE Final    Staphylococcus aureus POSITIVE (*) NEGATIVE Final      BRIEF HOSPITAL COURSE:   Principal Problem:  *Gall Stone Pancreatitis -patient was admitted, placed on NPO and given supportive measures. -CBD on ultrasound was in the upper limits of normal, and the ultrasound did show sludge in the GB. -he was seen by both GI and [facility]-felt that this was likely gall stone pancreatitis, subsequently underwent a lap cholecystectomy with intra-operative cholangiogram. Post op course was unremarkable, he has  tolerated a diet, has been seen by [facility] and is stable to be discharged home later today  Active Problems:  HTN (hypertension) -resume losartan and amlodipine on discharge   ETOH abuse -has been counseled extensively -no evidence of withdrawal this admit   Elevated LFTs -2/2 to Gall stone pancreatitis -now downtrending  Rest of the medical issues were stable  TODAY-DAY OF DISCHARGE:  Subjective:   Diallo Sandora today has no headache,no chest abdominal pain,no new weakness tingling or numbness, feels much better wants to go home today.   Objective:   Blood pressure 156/74, pulse 79, temperature 99.2 F (37.3 C), temperature source Oral, resp. rate 18, height 5\' 9"  (1.753 m), weight 93.8 kg (206 lb 12.7 oz), SpO2 96.00%.  Intake/Output Summary (Last 24 hours) at 07/01/12  1231 Last data filed at 07/01/12 0700  Gross per 24 hour  Intake    960 ml  Output   1775 ml  Net   -815 ml    Exam Awake Alert, Oriented *3, No new F.N deficits, Normal affect [place].AT,PERRAL Supple Neck,No JVD, No cervical lymphadenopathy appriciated.  Symmetrical Chest wall movement, Good air movement bilaterally, CTAB RRR,No Gallops,Rubs or new Murmurs, No Parasternal Heave +ve B.Sounds, Abd Soft, Non tender, No organomegaly appriciated, No rebound -guarding or rigidity. No Cyanosis, Clubbing or edema, No new Rash or bruise  DISCHARGE CONDITION: Stable  DISPOSITION:  HOME  DISCHARGE INSTRUCTIONS:    Activity:  As tolerated with Full fall precautions use walker/cane & assistance as needed  Diet recommendation: Heart Healthy diet   Follow-up Information    Follow up with [facility], MD in 3 weeks.   Contact information:   [facility] [address] [place] [address] [phone]       Follow up with Sid Falcon, MD. Schedule an appointment as soon as possible for a visit in 1 week.   Contact information:   [address] [phone]         Total Time spent on discharge equals 45 minutes.  SignedJeoffrey Massed 07/01/2012 12:31 PM

## 2012-07-01 NOTE — Progress Notes (Signed)
Patient ID: Dustin Kelly, male   DOB: 06-04-1941, 71 y.o.   MRN: [id number] 1 Day Post-Op  Subjective: Doing well, no major complaints overnight, abd sore but no severe pain.  Denies n/v  Objective: Vital signs in last 24 hours: Temp:  [97.2 F (36.2 C)-100.5 F (38.1 C)] 100.5 F (38.1 C) (07/19 0200) Pulse Rate:  [75-102] 89  (07/18 2019) Resp:  [14-18] 18  (07/18 2019) BP: (120-166)/(79-90) 131/79 mmHg (07/18 2019) SpO2:  [95 %-100 %] 95 % (07/18 2019) FiO2 (%):  [3 %] 3 % (07/18 0926) Weight:  [206 lb 12.7 oz (93.8 kg)] 206 lb 12.7 oz (93.8 kg) (07/19 0500) Last BM Date: 06/29/12  Intake/Output from previous day: 07/18 0701 - 07/19 0700 In: 1950 [P.O.:600; I.V.:1350] Out: 1825 [Urine:1775; Blood:50] Intake/Output this shift:    General appearance: alert and no distress GI: soft, mildly tender over incisions, incisions c/d/i, +bs  Lab Results:   Basename 07/01/12 0630 06/29/12 0616  WBC 7.4 7.4  HGB 11.8* 13.0  HCT 34.9* 37.9*  PLT 138* 153   BMET  Basename 06/29/12 0616  NA 140  K 3.1*  CL 105  CO2 26  GLUCOSE 102*  BUN 10  CREATININE 0.85  CALCIUM 9.2   PT/INR No results found for this basename: LABPROT:2,INR:2 in the last 72 hours ABG No results found for this basename: PHART:2,PCO2:2,PO2:2,HCO3:2 in the last 72 hours  Studies/Results: Dg Cholangiogram Operative  06/30/2012  *RADIOLOGY REPORT*  Clinical Data:   Laparoscopic cholecystectomy.  INTRAOPERATIVE CHOLANGIOGRAM  Comparison: 06/26/2012  Findings: Intraoperative spot images show normal caliber biliary system.  No evidence of retained stone or obstruction.  Free passage of contrast into the small bowel noted.  IMPRESSION: No evidence of retained stone or obstruction.  These images were submitted for radiologic interpretation only. Please see the procedural report for the amount of contrast and the fluoroscopy time utilized.  Original Report Authenticated By: Cyndie Chime, M.D.     Anti-infectives: Anti-infectives    None      Assessment/Plan: POD#1-Lap Chole: doing well, pain controlled, tolerating diet, can d/c home today from surgery standpoint, needs follow up in 2-3 weeks with Dr. Dwain Sarna, No heavy lifting for 2 weeks.  Discharge instructions in computer.     LOS: 5 days    Dustin Kelly 07/01/2012

## 2012-08-01 ENCOUNTER — Ambulatory Visit (INDEPENDENT_AMBULATORY_CARE_PROVIDER_SITE_OTHER): Payer: Medicare PPO | Admitting: General Surgery

## 2012-08-01 ENCOUNTER — Encounter (INDEPENDENT_AMBULATORY_CARE_PROVIDER_SITE_OTHER): Payer: Self-pay | Admitting: General Surgery

## 2012-08-01 VITALS — BP 130/64 | HR 64 | Temp 97.5°F | Resp 16 | Ht 69.5 in | Wt 195.6 lb

## 2012-08-01 DIAGNOSIS — Z09 Encounter for follow-up examination after completed treatment for conditions other than malignant neoplasm: Secondary | ICD-10-CM

## 2012-08-01 NOTE — Progress Notes (Signed)
Subjective:     Patient ID: Dustin Kelly, male   DOB: 28-Feb-1941, 71 y.o.   MRN: [id number]  HPI This is a 71 year old male I met while I was on call and the hospital with gallstone pancreatitis. His pancreatitis resolved to the operative and laparoscopic cholecystectomy with cholangiogram. He recovered well and was discharged home. He returns today doing very well without any complaints. He is eating well and having normal bowel movements.  Review of Systems     Objective:   Physical Exam Incisions healing well without infection    Assessment:     Status post laparoscopic cholecystectomy for gallstone pancreatitis    Plan:     He to return to full activity and come back and see me as needed.

## 2013-01-26 ENCOUNTER — Emergency Department (HOSPITAL_COMMUNITY): Payer: Medicare Other

## 2013-01-26 ENCOUNTER — Observation Stay (HOSPITAL_COMMUNITY)
Admission: EM | Admit: 2013-01-26 | Discharge: 2013-01-27 | Disposition: A | Discharge status: Home / Self Care | Payer: Medicare Other | Attending: General Surgery | Admitting: General Surgery

## 2013-01-26 ENCOUNTER — Encounter (HOSPITAL_COMMUNITY): Payer: Self-pay | Admitting: *Deleted

## 2013-01-26 DIAGNOSIS — S2249XA Multiple fractures of ribs, unspecified side, initial encounter for closed fracture: Principal | ICD-10-CM | POA: Insufficient documentation

## 2013-01-26 DIAGNOSIS — I10 Essential (primary) hypertension: Secondary | ICD-10-CM | POA: Insufficient documentation

## 2013-01-26 DIAGNOSIS — S2242XA Multiple fractures of ribs, left side, initial encounter for closed fracture: Secondary | ICD-10-CM

## 2013-01-26 DIAGNOSIS — R7989 Other specified abnormal findings of blood chemistry: Secondary | ICD-10-CM | POA: Insufficient documentation

## 2013-01-26 DIAGNOSIS — S2241XA Multiple fractures of ribs, right side, initial encounter for closed fracture: Secondary | ICD-10-CM

## 2013-01-26 DIAGNOSIS — R1013 Epigastric pain: Secondary | ICD-10-CM | POA: Insufficient documentation

## 2013-01-26 DIAGNOSIS — W108XXA Fall (on) (from) other stairs and steps, initial encounter: Secondary | ICD-10-CM | POA: Insufficient documentation

## 2013-01-26 DIAGNOSIS — M549 Dorsalgia, unspecified: Secondary | ICD-10-CM | POA: Insufficient documentation

## 2013-01-26 DIAGNOSIS — F1010 Alcohol abuse, uncomplicated: Secondary | ICD-10-CM | POA: Insufficient documentation

## 2013-01-26 DIAGNOSIS — W19XXXA Unspecified fall, initial encounter: Secondary | ICD-10-CM

## 2013-01-26 HISTORY — DX: Malignant (primary) neoplasm, unspecified: C80.1

## 2013-01-26 HISTORY — DX: Mental disorder, not otherwise specified: F99

## 2013-01-26 HISTORY — DX: Frequency of micturition: R35.0

## 2013-01-26 HISTORY — DX: Unspecified malignant neoplasm of skin, unspecified: C44.90

## 2013-01-26 LAB — CBC WITH DIFFERENTIAL/PLATELET
Basophils Absolute: 0 10*3/uL (ref 0.0–0.1)
Basophils Relative: 0 % (ref 0–1)
Eosinophils Absolute: 0 10*3/uL (ref 0.0–0.7)
Eosinophils Relative: 0 % (ref 0–5)
Lymphocytes Relative: 10 % — ABNORMAL LOW (ref 12–46)
MCV: 89.2 fL (ref 78.0–100.0)
Platelets: 169 10*3/uL (ref 150–400)
RDW: 13.3 % (ref 11.5–15.5)
WBC: 11.6 10*3/uL — ABNORMAL HIGH (ref 4.0–10.5)

## 2013-01-26 LAB — BASIC METABOLIC PANEL
CO2: 27 mEq/L (ref 19–32)
Calcium: 9.6 mg/dL (ref 8.4–10.5)
GFR calc Af Amer: >90 mL/min (ref >90)
GFR calc non Af Amer: 85 mL/min — ABNORMAL LOW (ref >90)
Sodium: 139 mEq/L (ref 135–145)

## 2013-01-26 MED ORDER — ONDANSETRON HCL 4 MG PO TABS: 4.0000 mg | ORAL_TABLET | Freq: Four times a day (QID) | ORAL | Status: DC | PRN

## 2013-01-26 MED ORDER — SODIUM CHLORIDE 0.9 % IJ SOLN: 3.0000 mL | INTRAMUSCULAR | Status: DC | PRN

## 2013-01-26 MED ORDER — HYDROMORPHONE HCL PF 1 MG/ML IJ SOLN
1.0000 mg | Freq: Once | INTRAMUSCULAR | Status: AC
  Administered 2013-01-26: 1 mg via INTRAVENOUS
  Filled 2013-01-26: qty 1

## 2013-01-26 MED ORDER — PANTOPRAZOLE SODIUM 40 MG PO TBEC
40.0000 mg | DELAYED_RELEASE_TABLET | Freq: Two times a day (BID) | ORAL | Status: DC
  Administered 2013-01-26 – 2013-01-27 (×2): 40 mg via ORAL
  Filled 2013-01-26 (×2): qty 1

## 2013-01-26 MED ORDER — NAPROXEN 500 MG PO TABS
500.0000 mg | ORAL_TABLET | Freq: Two times a day (BID) | ORAL | Status: DC
  Administered 2013-01-27: 500 mg via ORAL
  Filled 2013-01-26 (×6): qty 1

## 2013-01-26 MED ORDER — TIZANIDINE HCL 4 MG PO TABS
4.0000 mg | ORAL_TABLET | Freq: Four times a day (QID) | ORAL | Status: DC | PRN
  Filled 2013-01-26: qty 1

## 2013-01-26 MED ORDER — POLYETHYLENE GLYCOL 3350 17 G PO PACK
17.0000 g | PACK | Freq: Every day | ORAL | Status: DC
  Administered 2013-01-26 – 2013-01-27 (×2): 17 g via ORAL
  Filled 2013-01-26 (×2): qty 1

## 2013-01-26 MED ORDER — DOCUSATE SODIUM 100 MG PO CAPS
100.0000 mg | ORAL_CAPSULE | Freq: Two times a day (BID) | ORAL | Status: DC
  Administered 2013-01-26 – 2013-01-27 (×2): 100 mg via ORAL
  Filled 2013-01-26 (×2): qty 1

## 2013-01-26 MED ORDER — ONDANSETRON HCL 4 MG/2ML IJ SOLN
4.0000 mg | Freq: Four times a day (QID) | INTRAMUSCULAR | Status: DC | PRN
  Administered 2013-01-26: 4 mg via INTRAVENOUS
  Filled 2013-01-26: qty 2

## 2013-01-26 MED ORDER — BACITRACIN ZINC 500 UNIT/GM EX OINT
TOPICAL_OINTMENT | Freq: Two times a day (BID) | CUTANEOUS | Status: DC
  Administered 2013-01-26 – 2013-01-27 (×2): via TOPICAL
  Filled 2013-01-26: qty 15

## 2013-01-26 MED ORDER — ONDANSETRON HCL 4 MG/2ML IJ SOLN
4.0000 mg | Freq: Once | INTRAMUSCULAR | Status: DC
  Filled 2013-01-26: qty 2

## 2013-01-26 MED ORDER — IOHEXOL 300 MG/ML  SOLN
100.0000 mL | Freq: Once | INTRAMUSCULAR | Status: AC | PRN
  Administered 2013-01-26: 100 mL via INTRAVENOUS

## 2013-01-26 MED ORDER — TRAMADOL HCL 50 MG PO TABS
50.0000 mg | ORAL_TABLET | Freq: Four times a day (QID) | ORAL | Status: DC | PRN
  Administered 2013-01-27 (×2): 100 mg via ORAL
  Filled 2013-01-26 (×2): qty 2

## 2013-01-26 MED ORDER — ENOXAPARIN SODIUM 30 MG/0.3ML ~~LOC~~ SOLN
30.0000 mg | Freq: Two times a day (BID) | SUBCUTANEOUS | Status: DC
  Administered 2013-01-26 – 2013-01-27 (×2): 30 mg via SUBCUTANEOUS
  Filled 2013-01-26 (×4): qty 0.3

## 2013-01-26 MED ORDER — MORPHINE SULFATE 2 MG/ML IJ SOLN
2.0000 mg | INTRAMUSCULAR | Status: DC | PRN
  Administered 2013-01-26 – 2013-01-27 (×3): 2 mg via INTRAVENOUS
  Filled 2013-01-26 (×3): qty 1

## 2013-01-26 MED ORDER — BUPROPION HCL ER (XL) 300 MG PO TB24
300.0000 mg | ORAL_TABLET | Freq: Every day | ORAL | Status: DC
  Administered 2013-01-26 – 2013-01-27 (×2): 300 mg via ORAL
  Filled 2013-01-26 (×2): qty 1

## 2013-01-26 MED ORDER — LOSARTAN POTASSIUM 50 MG PO TABS
100.0000 mg | ORAL_TABLET | Freq: Every day | ORAL | Status: DC
  Administered 2013-01-26 – 2013-01-27 (×2): 100 mg via ORAL
  Filled 2013-01-26 (×2): qty 2

## 2013-01-26 MED ORDER — SODIUM CHLORIDE 0.9 % IV SOLN: 250.0000 mL | INTRAVENOUS | Status: DC | PRN

## 2013-01-26 MED ORDER — AMLODIPINE BESYLATE 5 MG PO TABS
5.0000 mg | ORAL_TABLET | Freq: Every day | ORAL | Status: DC
  Administered 2013-01-26 – 2013-01-27 (×2): 5 mg via ORAL
  Filled 2013-01-26 (×2): qty 1

## 2013-01-26 MED ORDER — PANTOPRAZOLE SODIUM 40 MG IV SOLR
40.0000 mg | Freq: Two times a day (BID) | INTRAVENOUS | Status: DC
  Filled 2013-01-26 (×2): qty 40

## 2013-01-26 MED ORDER — DARIFENACIN HYDROBROMIDE ER 7.5 MG PO TB24
7.5000 mg | ORAL_TABLET | Freq: Every day | ORAL | Status: DC
  Administered 2013-01-26 – 2013-01-27 (×2): 7.5 mg via ORAL
  Filled 2013-01-26 (×2): qty 1

## 2013-01-26 MED ORDER — ONDANSETRON HCL 4 MG/2ML IJ SOLN
4.0000 mg | Freq: Once | INTRAMUSCULAR | Status: AC
  Administered 2013-01-26: 4 mg via INTRAVENOUS

## 2013-01-26 MED ORDER — IOHEXOL 300 MG/ML  SOLN
25.0000 mL | INTRAMUSCULAR | Status: AC
  Administered 2013-01-26 (×2): 25 mL via ORAL

## 2013-01-26 MED ORDER — FINASTERIDE 5 MG PO TABS
5.0000 mg | ORAL_TABLET | Freq: Every day | ORAL | Status: DC
  Administered 2013-01-26 – 2013-01-27 (×2): 5 mg via ORAL
  Filled 2013-01-26 (×2): qty 1

## 2013-01-26 MED ORDER — SODIUM CHLORIDE 0.9 % IV SOLN
Freq: Once | INTRAVENOUS | Status: AC
  Administered 2013-01-26: 20 mL via INTRAVENOUS

## 2013-01-26 MED ORDER — CHLORHEXIDINE GLUCONATE 0.12 % MT SOLN
15.0000 mL | Freq: Every day | OROMUCOSAL | Status: DC
  Administered 2013-01-26: 15 mL via OROMUCOSAL
  Filled 2013-01-26: qty 15

## 2013-01-26 MED ORDER — SODIUM CHLORIDE 0.9 % IJ SOLN
3.0000 mL | Freq: Two times a day (BID) | INTRAMUSCULAR | Status: DC
  Administered 2013-01-26 – 2013-01-27 (×2): 3 mL via INTRAVENOUS

## 2013-01-26 MED ORDER — TIMOLOL MALEATE 0.5 % OP SOLN
1.0000 [drp] | Freq: Every day | OPHTHALMIC | Status: DC
  Administered 2013-01-27: 1 [drp] via OPHTHALMIC
  Filled 2013-01-26: qty 5

## 2013-01-26 MED ORDER — TIMOLOL HEMIHYDRATE 0.5 % OP SOLN: 1.0000 [drp] | Freq: Every day | OPHTHALMIC | Status: DC

## 2013-01-26 NOTE — ED Provider Notes (Deleted)
History     CSN: [id number]  Arrival date & time 01/26/13  1015   First MD Initiated Contact with Patient 01/26/13 1025      Chief Complaint  Patient presents with  . Back Pain    (Consider location/radiation/quality/duration/timing/severity/associated sxs/prior treatment) Patient is a 72 y.o. male presenting with back pain. The history is provided by the patient. No language interpreter was used.  Back Pain Location:  Thoracic spine Quality:  Aching and stabbing Radiates to:  Does not radiate Pain severity:  Moderate Pain is:  Same all the time Onset quality:  Sudden Duration:  18 hours Timing:  Constant Progression:  Worsening Chronicity:  New Relieved by:  Nothing Ineffective treatments:  None tried Associated symptoms: abdominal pain and chest pain     Past Medical History  Diagnosis Date  . Hypertension   . Acid reflux   . Diverticulosis   . Cancer   . Skin cancer     Past Surgical History  Procedure Laterality Date  . Cholecystectomy  06/30/2012    Procedure: LAPAROSCOPIC CHOLECYSTECTOMY WITH INTRAOPERATIVE CHOLANGIOGRAM;  Surgeon: Emelia Loron, MD;  Location: [facility];  Service: General;  Laterality: N/A;    Family History  Problem Relation Age of Onset  . Diabetes Father     History  Substance Use Topics  . Smoking status: Former Smoker -- 0.25 packs/day for 5 years    Types: Cigarettes  . Smokeless tobacco: Not on file  . Alcohol Use: 2.4 oz/week    4 Shots of liquor per week      Review of Systems  Respiratory: Negative for cough and shortness of breath.   Cardiovascular: Positive for chest pain.  Gastrointestinal: Positive for abdominal pain.  Musculoskeletal: Positive for back pain.  All other systems reviewed and are negative.    Allergies  Review of patient's allergies indicates no known allergies.  Home Medications   Current Outpatient Rx  Name  Route  Sig  Dispense  Refill  . amLODipine (NORVASC) 5 MG tablet   Oral    Take 5 mg by mouth daily.         Marland Kitchen buPROPion (WELLBUTRIN XL) 300 MG 24 hr tablet   Oral   Take 300 mg by mouth daily.         . chlorhexidine (PERIDEX) 0.12 % solution   Mouth Rinse   15 mLs by Mouth Rinse route at bedtime. To brush denture implants         . darifenacin (ENABLEX) 7.5 MG 24 hr tablet   Oral   Take 7.5 mg by mouth daily.         . finasteride (PROSCAR) 5 MG tablet   Oral   Take 5 mg by mouth daily.         Marland Kitchen glucosamine-chondroitin 500-400 MG tablet   Oral   Take 1 tablet by mouth 2 (two) times daily.         Marland Kitchen losartan (COZAAR) 100 MG tablet   Oral   Take 100 mg by mouth daily.         . Multiple Vitamin (MULTIVITAMIN WITH MINERALS) TABS   Oral   Take 1 tablet by mouth daily.         Marland Kitchen omeprazole (PRILOSEC) 40 MG capsule   Oral   Take 40 mg by mouth daily.         . timolol (BETIMOL) 0.5 % ophthalmic solution   Both Eyes   Place 1 drop into both  eyes daily.           BP 184/98  Pulse 63  Temp(Src) 97.3 F (36.3 C) (Oral)  Ht 5\' 10"  (1.778 m)  Wt 192 lb (87.091 kg)  BMI 27.55 kg/m2  SpO2 94%  Physical Exam  Constitutional: He appears well-developed and well-nourished.  HENT:  Head: Normocephalic.  Right Ear: External ear normal.  Left Ear: External ear normal.  Mouth/Throat: Oropharyngeal exudate present.  Eyes: Conjunctivae and EOM are normal. Pupils are equal, round, and reactive to light.  Neck: Normal range of motion. Thyromegaly present.  Cardiovascular: Normal rate and normal heart sounds.   Pulmonary/Chest: Effort normal.  Tender right back to palpation,   thororacic spine nontender  Abdominal: Soft.  Musculoskeletal: Normal range of motion.  Neurological: He is alert.  Skin: Skin is warm.  Psychiatric: He has a normal mood and affect.    ED Course  Procedures (including critical care time)  Labs Reviewed  CBC WITH DIFFERENTIAL - Abnormal; Notable for the following:    WBC 11.6 (*)    Neutrophils  Relative 81 (*)    Neutro Abs 9.4 (*)    Lymphocytes Relative 10 (*)    All other components within normal limits  BASIC METABOLIC PANEL - Abnormal; Notable for the following:    Glucose, Bld 136 (*)    BUN 33 (*)    GFR calc non Af Amer 85 (*)    All other components within normal limits   Dg Ribs Unilateral W/chest Right  01/26/2013  *RADIOLOGY REPORT*  Clinical Data: Pain post fall  RIGHT RIBS AND CHEST - 3+ VIEW  Comparison: None.  Findings: Four views right ribs submitted.  No acute infiltrate. No diagnostic pneumothorax.  Mild displaced fracture of the right eighth rib.  Nondisplaced fracture of the right sixth and seventh ribs.  There is a nodular density in the left upper hemithorax. Although this may be due to artifact due to overlapping of first rib with fifth rib I cannot exclude a lung nodule.  Comparison with prior films would be helpful.  If there are no prior films follow- up short interval two-view chest x-ray or CT scan is recommended.  IMPRESSION: No acute infiltrate.  No diagnostic pneumothorax.  Mild displaced fracture of the right eighth rib.  Nondisplaced fracture of the right sixth and seventh ribs.  There is a nodular density in the left upper hemithorax.  Although this may be due to artifact due to overlapping of first rib with fifth rib I cannot exclude a lung nodule.  Comparison with prior films would be helpful.  If there are no prior films follow-up short interval two-view chest x-ray or CT scan is recommended.   Original Report Authenticated By: Natasha Mead, M.D.    Ct Chest W Contrast  01/26/2013  *RADIOLOGY REPORT*  Clinical Data:  Fall 3 weeks ago.  Right-sided chest, abdominal, and rib pain.  Left lung nodule on chest radiograph.  CT CHEST, ABDOMEN AND PELVIS WITH CONTRAST  Technique:  Multidetector CT imaging of the chest, abdomen and pelvis was performed following the standard protocol during bolus administration of intravenous contrast.  Contrast: OMNIPAQUE  IOHEXOL 300 MG/ML  SOLN  Comparison:  Chest and rib radiographs earlier today  CT CHEST  Findings:  No evidence of thoracic aortic injury or mediastinal hematoma.  No evidence of pneumothorax or hemothorax.  Mild dependent atelectasis noted bilaterally.  Mildly displaced fractures are seen involving the right posterior ninth and tenth ribs and  the right lateral seventh and eighth ribs.  Prominent spurring seen at left anterior first costochondral junction, but no suspicious pulmonary nodules or masses are identified.  No evidence of pulmonary infiltrate or central endobronchial lesion.  No evidence of hilar or mediastinal lymphadenopathy.  No adenopathy seen elsewhere within the thorax. No evidence of chest wall mass.  IMPRESSION:  Mildly displaced fractures of right seventh through tenth ribs.  No evidence of pneumothorax, hemothorax, or other significant abnormality.  CT ABDOMEN AND PELVIS  Findings:  No evidence of lacerations or contusions involving the abdominal parenchymal organs.  No evidence of hemoperitoneum.  A small fluid attenuation cyst is seen in the liver adjacent gallbladder fossa.  No liver masses are identified. No other soft tissue masses or lymphadenopathy identified within the abdomen or pelvis.  The spleen, pancreas, adrenal glands, and kidneys are normal in appearance.  There is no evidence of inflammatory process or abnormal fluid collections.  Diverticulosis is seen mainly involving the descending and sigmoid colon, however there is no evidence of diverticulitis.  No evidence of dilated bowel loops.  Mildly enlarged prostate gland is noted.  Seminal vesicles remains symmetric.  No fractures are identified.  Thoracolumbar spine degenerative changes are noted, as well as bilateral hip DJD.  IMPRESSION:  1.  No evidence of visceral injury, hemoperitoneum, or other acute findings. 2.  Diverticulosis.  No radiographic evidence of diverticulitis. 3.  Mildly enlarged prostate.   Original Report  Authenticated By: Myles Rosenthal, M.D.    Ct Abdomen Pelvis W Contrast  01/26/2013  *RADIOLOGY REPORT*  Clinical Data:  Fall 3 weeks ago.  Right-sided chest, abdominal, and rib pain.  Left lung nodule on chest radiograph.  CT CHEST, ABDOMEN AND PELVIS WITH CONTRAST  Technique:  Multidetector CT imaging of the chest, abdomen and pelvis was performed following the standard protocol during bolus administration of intravenous contrast.  Contrast: OMNIPAQUE IOHEXOL 300 MG/ML  SOLN  Comparison:  Chest and rib radiographs earlier today  CT CHEST  Findings:  No evidence of thoracic aortic injury or mediastinal hematoma.  No evidence of pneumothorax or hemothorax.  Mild dependent atelectasis noted bilaterally.  Mildly displaced fractures are seen involving the right posterior ninth and tenth ribs and the right lateral seventh and eighth ribs.  Prominent spurring seen at left anterior first costochondral junction, but no suspicious pulmonary nodules or masses are identified.  No evidence of pulmonary infiltrate or central endobronchial lesion.  No evidence of hilar or mediastinal lymphadenopathy.  No adenopathy seen elsewhere within the thorax. No evidence of chest wall mass.  IMPRESSION:  Mildly displaced fractures of right seventh through tenth ribs.  No evidence of pneumothorax, hemothorax, or other significant abnormality.  CT ABDOMEN AND PELVIS  Findings:  No evidence of lacerations or contusions involving the abdominal parenchymal organs.  No evidence of hemoperitoneum.  A small fluid attenuation cyst is seen in the liver adjacent gallbladder fossa.  No liver masses are identified. No other soft tissue masses or lymphadenopathy identified within the abdomen or pelvis.  The spleen, pancreas, adrenal glands, and kidneys are normal in appearance.  There is no evidence of inflammatory process or abnormal fluid collections.  Diverticulosis is seen mainly involving the descending and sigmoid colon, however there is no  evidence of diverticulitis.  No evidence of dilated bowel loops.  Mildly enlarged prostate gland is noted.  Seminal vesicles remains symmetric.  No fractures are identified.  Thoracolumbar spine degenerative changes are noted, as well as bilateral hip DJD.  IMPRESSION:  1.  No evidence of visceral injury, hemoperitoneum, or other acute findings. 2.  Diverticulosis.  No radiographic evidence of diverticulitis. 3.  Mildly enlarged prostate.   Original Report Authenticated By: Myles Rosenthal, M.D.      1. Multiple rib fractures, left, closed, initial encounter       MDM  Dr. Bebe Shaggy in to see and examine pt.   Ct scan abdomen, pelvis and chest obtained. Pt has 4 rib fractures.   Pt lives alone.   I spoke to Dr. Janee Morn Trauma Surgeon who will see for admission.        Lonia Skinner [place], [place] 01/26/13 1405  Lonia Skinner [place], [place] 01/28/13 1248

## 2013-01-26 NOTE — ED Notes (Signed)
Pt returned from xray

## 2013-01-26 NOTE — ED Notes (Signed)
Pt fell yesterday and had pain in right back, rib area.  Pt continues to have pain.  Pt ambulatory

## 2013-01-26 NOTE — ED Notes (Signed)
Pt has completed drinking both cups of contrast.

## 2013-01-26 NOTE — ED Notes (Signed)
Returned from xray

## 2013-01-26 NOTE — ED Notes (Signed)
Dr. Thompson in. 

## 2013-01-26 NOTE — H&P (Signed)
Dustin Kelly is an 72 y.o. male.   Chief Complaint: Right-sided rib pain HPI: Patient was going down some steps outside yesterday afternoon in the snow when he fell. He struck his right side. He had no loss of consciousness. He suffered some abrasions to his lower extremities. He had soreness but it was initially not that severe. He had no shortness of breath. As the night progressed, however, his pain gradually increased. He was unable to sleep very well. He had difficulties with ADLs this morning. He spoke with his neighbors and car ride to the emergency department. He was evaluated here and found to have right posterior rib fractures 7 through 10. We are asked to see him from a trauma surgery standpoint. Denies shortness of breath at this time but has significant pain in his right ribs with movement.  Past Medical History  Diagnosis Date  . Hypertension   . Acid reflux   . Diverticulosis   . Cancer   . Skin cancer     Past Surgical History  Procedure Laterality Date  . Cholecystectomy  06/30/2012    Procedure: LAPAROSCOPIC CHOLECYSTECTOMY WITH INTRAOPERATIVE CHOLANGIOGRAM;  Surgeon: Emelia Loron, MD;  Location: [facility] OR;  Service: General;  Laterality: N/A;    Family History  Problem Relation Age of Onset  . Diabetes Father    Social History:  reports that he has quit smoking. His smoking use included Cigarettes. He has a 1.25 pack-year smoking history. He does not have any smokeless tobacco history on file. He reports that he drinks about 2.4 ounces of alcohol per week. He reports that he does not use illicit drugs.  Allergies: No Known Allergies   (Not in a hospital admission)  Results for orders placed during the hospital encounter of 01/26/13 (from the past 48 hour(s))  CBC WITH DIFFERENTIAL     Status: Abnormal   Collection Time    01/26/13 11:24 AM      Result Value Range   WBC 11.6 (*) 4.0 - 10.5 K/uL   RBC 4.72  4.22 - 5.81 MIL/uL   Hemoglobin 14.4  13.0 - 17.0  g/dL   HCT 16.1  09.6 - 04.5 %   MCV 89.2  78.0 - 100.0 fL   MCH 30.5  26.0 - 34.0 pg   MCHC 34.2  30.0 - 36.0 g/dL   RDW 40.9  81.1 - 91.4 %   Platelets 169  150 - 400 K/uL   Neutrophils Relative 81 (*) 43 - 77 %   Neutro Abs 9.4 (*) 1.7 - 7.7 K/uL   Lymphocytes Relative 10 (*) 12 - 46 %   Lymphs Abs 1.2  0.7 - 4.0 K/uL   Monocytes Relative 9  3 - 12 %   Monocytes Absolute 1.0  0.1 - 1.0 K/uL   Eosinophils Relative 0  0 - 5 %   Eosinophils Absolute 0.0  0.0 - 0.7 K/uL   Basophils Relative 0  0 - 1 %   Basophils Absolute 0.0  0.0 - 0.1 K/uL  BASIC METABOLIC PANEL     Status: Abnormal   Collection Time    01/26/13 11:24 AM      Result Value Range   Sodium 139  135 - 145 mEq/L   Potassium 4.3  3.5 - 5.1 mEq/L   Chloride 103  96 - 112 mEq/L   CO2 27  19 - 32 mEq/L   Glucose, Bld 136 (*) 70 - 99 mg/dL   BUN 33 (*) 6 -  23 mg/dL   Creatinine, Ser 4.54  0.50 - 1.35 mg/dL   Calcium 9.6  8.4 - 09.8 mg/dL   GFR calc non Af Amer 85 (*) >90 mL/min   GFR calc Af Amer >90  >90 mL/min   Comment:            The eGFR has been calculated     using the CKD EPI equation.     This calculation has not been     validated in all clinical     situations.     eGFR's persistently     <90 mL/min signify     possible Chronic Kidney Disease.   Dg Ribs Unilateral W/chest Right  01/26/2013  *RADIOLOGY REPORT*  Clinical Data: Pain post fall  RIGHT RIBS AND CHEST - 3+ VIEW  Comparison: None.  Findings: Four views right ribs submitted.  No acute infiltrate. No diagnostic pneumothorax.  Mild displaced fracture of the right eighth rib.  Nondisplaced fracture of the right sixth and seventh ribs.  There is a nodular density in the left upper hemithorax. Although this may be due to artifact due to overlapping of first rib with fifth rib I cannot exclude a lung nodule.  Comparison with prior films would be helpful.  If there are no prior films follow- up short interval two-view chest x-ray or CT scan is  recommended.  IMPRESSION: No acute infiltrate.  No diagnostic pneumothorax.  Mild displaced fracture of the right eighth rib.  Nondisplaced fracture of the right sixth and seventh ribs.  There is a nodular density in the left upper hemithorax.  Although this may be due to artifact due to overlapping of first rib with fifth rib I cannot exclude a lung nodule.  Comparison with prior films would be helpful.  If there are no prior films follow-up short interval two-view chest x-ray or CT scan is recommended.   Original Report Authenticated By: Natasha Mead, M.D.    Ct Chest W Contrast  01/26/2013  *RADIOLOGY REPORT*  Clinical Data:  Fall 3 weeks ago.  Right-sided chest, abdominal, and rib pain.  Left lung nodule on chest radiograph.  CT CHEST, ABDOMEN AND PELVIS WITH CONTRAST  Technique:  Multidetector CT imaging of the chest, abdomen and pelvis was performed following the standard protocol during bolus administration of intravenous contrast.  Contrast: OMNIPAQUE IOHEXOL 300 MG/ML  SOLN  Comparison:  Chest and rib radiographs earlier today  CT CHEST  Findings:  No evidence of thoracic aortic injury or mediastinal hematoma.  No evidence of pneumothorax or hemothorax.  Mild dependent atelectasis noted bilaterally.  Mildly displaced fractures are seen involving the right posterior ninth and tenth ribs and the right lateral seventh and eighth ribs.  Prominent spurring seen at left anterior first costochondral junction, but no suspicious pulmonary nodules or masses are identified.  No evidence of pulmonary infiltrate or central endobronchial lesion.  No evidence of hilar or mediastinal lymphadenopathy.  No adenopathy seen elsewhere within the thorax. No evidence of chest wall mass.  IMPRESSION:  Mildly displaced fractures of right seventh through tenth ribs.  No evidence of pneumothorax, hemothorax, or other significant abnormality.  CT ABDOMEN AND PELVIS  Findings:  No evidence of lacerations or contusions involving  the abdominal parenchymal organs.  No evidence of hemoperitoneum.  A small fluid attenuation cyst is seen in the liver adjacent gallbladder fossa.  No liver masses are identified. No other soft tissue masses or lymphadenopathy identified within the abdomen or pelvis.  The  spleen, pancreas, adrenal glands, and kidneys are normal in appearance.  There is no evidence of inflammatory process or abnormal fluid collections.  Diverticulosis is seen mainly involving the descending and sigmoid colon, however there is no evidence of diverticulitis.  No evidence of dilated bowel loops.  Mildly enlarged prostate gland is noted.  Seminal vesicles remains symmetric.  No fractures are identified.  Thoracolumbar spine degenerative changes are noted, as well as bilateral hip DJD.  IMPRESSION:  1.  No evidence of visceral injury, hemoperitoneum, or other acute findings. 2.  Diverticulosis.  No radiographic evidence of diverticulitis. 3.  Mildly enlarged prostate.   Original Report Authenticated By: Myles Rosenthal, M.D.    Ct Abdomen Pelvis W Contrast  01/26/2013  *RADIOLOGY REPORT*  Clinical Data:  Fall 3 weeks ago.  Right-sided chest, abdominal, and rib pain.  Left lung nodule on chest radiograph.  CT CHEST, ABDOMEN AND PELVIS WITH CONTRAST  Technique:  Multidetector CT imaging of the chest, abdomen and pelvis was performed following the standard protocol during bolus administration of intravenous contrast.  Contrast: OMNIPAQUE IOHEXOL 300 MG/ML  SOLN  Comparison:  Chest and rib radiographs earlier today  CT CHEST  Findings:  No evidence of thoracic aortic injury or mediastinal hematoma.  No evidence of pneumothorax or hemothorax.  Mild dependent atelectasis noted bilaterally.  Mildly displaced fractures are seen involving the right posterior ninth and tenth ribs and the right lateral seventh and eighth ribs.  Prominent spurring seen at left anterior first costochondral junction, but no suspicious pulmonary nodules or masses  are identified.  No evidence of pulmonary infiltrate or central endobronchial lesion.  No evidence of hilar or mediastinal lymphadenopathy.  No adenopathy seen elsewhere within the thorax. No evidence of chest wall mass.  IMPRESSION:  Mildly displaced fractures of right seventh through tenth ribs.  No evidence of pneumothorax, hemothorax, or other significant abnormality.  CT ABDOMEN AND PELVIS  Findings:  No evidence of lacerations or contusions involving the abdominal parenchymal organs.  No evidence of hemoperitoneum.  A small fluid attenuation cyst is seen in the liver adjacent gallbladder fossa.  No liver masses are identified. No other soft tissue masses or lymphadenopathy identified within the abdomen or pelvis.  The spleen, pancreas, adrenal glands, and kidneys are normal in appearance.  There is no evidence of inflammatory process or abnormal fluid collections.  Diverticulosis is seen mainly involving the descending and sigmoid colon, however there is no evidence of diverticulitis.  No evidence of dilated bowel loops.  Mildly enlarged prostate gland is noted.  Seminal vesicles remains symmetric.  No fractures are identified.  Thoracolumbar spine degenerative changes are noted, as well as bilateral hip DJD.  IMPRESSION:  1.  No evidence of visceral injury, hemoperitoneum, or other acute findings. 2.  Diverticulosis.  No radiographic evidence of diverticulitis. 3.  Mildly enlarged prostate.   Original Report Authenticated By: Myles Rosenthal, M.D.     Review of Systems  Constitutional: Negative.   HENT: Negative.           Eyes:       Wears glasses  Respiratory:       See history of present illness  Cardiovascular:       Right-sided posterior chest pain with movement  Gastrointestinal: Positive for constipation. Negative for nausea, vomiting and abdominal pain.       History of diverticulosis  Genitourinary: Negative.   Musculoskeletal:       Abrasions bilateral shin  Skin:  See history of  present illness  Neurological: Negative.   Endo/Heme/Allergies: Negative.     Blood pressure 145/83, pulse 71, temperature 97.3 F (36.3 C), temperature source Oral, resp. rate 16, height 5\' 10"  (1.778 m), weight 87.091 kg (192 lb), SpO2 94.00%. Physical Exam  Constitutional: He is oriented to person, place, and time. He appears well-developed and well-nourished. No distress.  HENT:  Head: Normocephalic and atraumatic.  Mouth/Throat: Oropharynx is clear and moist. No oropharyngeal exudate.  Eyes: Conjunctivae and EOM are normal. Pupils are equal, round, and reactive to light. Right eye exhibits no discharge. Left eye exhibits no discharge. No scleral icterus.  Neck: Normal range of motion. Neck supple. No tracheal deviation present. No thyromegaly present.  Cardiovascular: Normal rate, regular rhythm, normal heart sounds and intact distal pulses.   Respiratory: Effort normal and breath sounds normal. No stridor. No respiratory distress. He has no wheezes. He has no rales.   He exhibits tenderness.  Right posterior lateral rib tenderness, no significant contusion, no crepitance  GI: Soft. Bowel sounds are normal. He exhibits no distension and no mass. There is no tenderness. There is no rebound and no guarding.  Musculoskeletal: Normal range of motion.       Legs: Small skin tears/abrasions bilateral shin  Lymphadenopathy:    He has no cervical adenopathy.  Neurological: He is alert and oriented to person, place, and time. He has normal strength. He displays no atrophy and no tremor. No sensory deficit. He exhibits normal muscle tone. GCS eye subscore is 4. GCS verbal subscore is 5. GCS motor subscore is 6.  Skin: Skin is warm and dry.  See above     Assessment/Plan Status post fall with right posterior rib fractures 7 through 10. Will admit to trauma service for pain control and pulmonary toilet. Check followup chest x-ray in the morning. Continue home medications. Plan of care was  discussed in detail with the patient. Questions answered. Lilley Hubble E 01/26/2013, 2:42 PM

## 2013-01-26 NOTE — ED Notes (Signed)
Patient transported to CT 

## 2013-01-26 NOTE — ED Notes (Signed)
Patient transported to X-ray 

## 2013-01-26 NOTE — ED Notes (Signed)
Pt fell in back yard at his home at 2100 last night. Fell striking back on soil surface. Pt states he tried to catch himself and thinks he "torked" his back.

## 2013-01-26 NOTE — ED Provider Notes (Signed)
History     CSN: [id number]  Arrival date & time 01/26/13  1015   First MD Initiated Contact with Patient 01/26/13 1025      Chief Complaint  Patient presents with  . Back Pain    (Consider location/radiation/quality/duration/timing/severity/associated sxs/prior treatment) Patient is a 72 y.o. male presenting with back pain. The history is provided by the patient. No language interpreter was used.  Back Pain Location:  Generalized Quality:  Aching and shooting   Past Medical History  Diagnosis Date  . Hypertension   . Acid reflux   . Diverticulosis     Past Surgical History  Procedure Laterality Date  . Cholecystectomy  06/30/2012    Procedure: LAPAROSCOPIC CHOLECYSTECTOMY WITH INTRAOPERATIVE CHOLANGIOGRAM;  Surgeon: Emelia Loron, MD;  Location: [facility] OR;  Service: General;  Laterality: N/A;    Family History  Problem Relation Age of Onset  . Diabetes Father     History  Substance Use Topics  . Smoking status: Former Smoker -- 0.25 packs/day for 5 years    Types: Cigarettes  . Smokeless tobacco: Not on file  . Alcohol Use: 2.4 oz/week    4 Shots of liquor per week      Review of Systems  Musculoskeletal: Positive for back pain.    Allergies  Review of patient's allergies indicates no known allergies.  Home Medications   Current Outpatient Rx  Name  Route  Sig  Dispense  Refill  . amLODipine (NORVASC) 5 MG tablet   Oral   Take 5 mg by mouth daily.         Marland Kitchen buPROPion (WELLBUTRIN XL) 300 MG 24 hr tablet   Oral   Take 300 mg by mouth daily.         . chlorhexidine (PERIDEX) 0.12 % solution   Mouth Rinse   15 mLs by Mouth Rinse route at bedtime. To brush denture implants         . darifenacin (ENABLEX) 7.5 MG 24 hr tablet   Oral   Take 7.5 mg by mouth daily.         . finasteride (PROSCAR) 5 MG tablet   Oral   Take 5 mg by mouth daily.         Marland Kitchen glucosamine-chondroitin 500-400 MG tablet   Oral   Take 1 tablet by mouth 2 (two)  times daily.         Marland Kitchen losartan (COZAAR) 100 MG tablet   Oral   Take 100 mg by mouth daily.         . Multiple Vitamin (MULTIVITAMIN WITH MINERALS) TABS   Oral   Take 1 tablet by mouth daily.         Marland Kitchen omeprazole (PRILOSEC) 40 MG capsule   Oral   Take 40 mg by mouth daily.         . timolol (BETIMOL) 0.5 % ophthalmic solution   Both Eyes   Place 1 drop into both eyes daily.           BP 184/98  Pulse 63  Temp(Src) 97.3 F (36.3 C) (Oral)  Ht 5\' 10"  (1.778 m)  Wt 192 lb (87.091 kg)  BMI 27.55 kg/m2  SpO2 94%  Physical Exam  ED Course  Procedures (including critical care time)  Labs Reviewed - No data to display No results found.   No diagnosis found.    MDM          Elson Areas, PA 01/28/13 [phone]

## 2013-01-27 ENCOUNTER — Observation Stay (HOSPITAL_COMMUNITY): Payer: Medicare Other

## 2013-01-27 MED ORDER — TRAMADOL HCL 50 MG PO TABS: 50.0000 mg | ORAL_TABLET | Freq: Four times a day (QID) | ORAL | Status: AC

## 2013-01-27 MED ORDER — HYDROCODONE-ACETAMINOPHEN 5-325 MG PO TABS: 1.0000 | ORAL_TABLET | ORAL | Status: DC | PRN

## 2013-01-27 MED ORDER — NAPROXEN 500 MG PO TABS: 500.0000 mg | ORAL_TABLET | Freq: Two times a day (BID) | ORAL | Status: AC

## 2013-01-27 NOTE — Discharge Summary (Signed)
Physician Discharge Summary  Patient ID: Dustin Kelly MRN: [id number] DOB/AGE: 1941-08-03 72 y.o.  Admit date: 01/26/2013 Discharge date: 01/27/2013  Discharge Diagnoses Patient Active Problem List   Diagnosis Date Noted  . Fall 01/26/2013  . Multiple fractures of ribs of right side 01/26/2013  . Abdominal pain, acute, epigastric 06/26/2012  . HTN (hypertension) 06/26/2012  . ETOH abuse 06/26/2012  . Elevated LFTs 06/26/2012    Consultants None   Procedures None   HPI: Patient was going down some steps outside in the snow the day before presentation when he fell. He struck his right side. He had no loss of consciousness. He suffered some abrasions to his lower extremities. He had soreness but it was initially not that severe. He had no shortness of breath. As the night progressed, however, his pain gradually increased. He was unable to sleep very well. He had difficulties with ADLs this morning. He spoke with his neighbors and car ride to the emergency department. He was evaluated here and found to have right posterior rib fractures 7 through 10. He was admitted for pain control and pulmonary toilet.   Hospital Course: The patient was much more comfortable after his night in the hospital. We tried controlling his pain on tramadol and NSAID's alone but it was not effective enough. His follow-up chest x-ray showed some airspace disease on the right side but he was able to maximize his incentive spirometer and maintained good oxygen saturation. He was offered one more night in the hospital to titrated his pain medication but he felt he needed to get home to attend to some personal matters and thought he would do ok. We decided to schedule his tramadol and give him some Norco for breakthrough pain. He was discharged home in improved condition.      Medication List    TAKE these medications       amLODipine 5 MG tablet  Commonly known as:  NORVASC  Take 5 mg by mouth daily.     buPROPion 300 MG 24 hr tablet  Commonly known as:  WELLBUTRIN XL  Take 300 mg by mouth daily.     chlorhexidine 0.12 % solution  Commonly known as:  PERIDEX  15 mLs by Mouth Rinse route at bedtime. To brush denture implants     darifenacin 7.5 MG 24 hr tablet  Commonly known as:  ENABLEX  Take 7.5 mg by mouth daily.     finasteride 5 MG tablet  Commonly known as:  PROSCAR  Take 5 mg by mouth daily.     glucosamine-chondroitin 500-400 MG tablet  Take 1 tablet by mouth 2 (two) times daily.     HYDROcodone-acetaminophen 5-325 MG per tablet  Commonly known as:  NORCO  Take 1-2 tablets by mouth every 4 (four) hours as needed for pain.     losartan 100 MG tablet  Commonly known as:  COZAAR  Take 100 mg by mouth daily.     multivitamin with minerals Tabs  Take 1 tablet by mouth daily.     naproxen 500 MG tablet  Commonly known as:  NAPROSYN  Take 1 tablet (500 mg total) by mouth 2 (two) times daily with a meal.     omeprazole 40 MG capsule  Commonly known as:  PRILOSEC  Take 40 mg by mouth daily.     timolol 0.5 % ophthalmic solution  Commonly known as:  BETIMOL  Place 1 drop into both eyes daily.     traMADol 50 MG  tablet  Commonly known as:  ULTRAM  Take 1-2 tablets (50-100 mg total) by mouth every 6 (six) hours.             Follow-up Information   Call [facility]. (As needed)    Contact information:   [address] [place] [address] [phone]       Signed: Freeman Caldron, PA-C Pager: [phone] General Trauma PA Pager: [phone]  01/27/2013, 3:29 PM

## 2013-01-27 NOTE — Progress Notes (Signed)
Patient looks fine.  Should be able to go home today, later this afternoon.  Will get PT involved.  This patient has been seen and I agree with the findings and treatment plan.  Marta Lamas. Gae Bon, MD, FACS [phone] (pager) [phone] (direct pager) Trauma Surgeon

## 2013-01-27 NOTE — Progress Notes (Signed)
UR completed 

## 2013-01-27 NOTE — Progress Notes (Signed)
Patient ID: Dustin Kelly, male   DOB: [birth year], 72 y.o.   MRN: [id number]   LOS: 1 day   Subjective: C/o significant pain, especially when moving, but able to mobilize with help.   Objective: Vital signs in last 24 hours: Temp:  [97.2 F (36.2 C)-99.4 F (37.4 C)] 99.4 F (37.4 C) (02/14 0620) Pulse Rate:  [63-82] 74 (02/14 0620) Resp:  [16-18] 18 (02/14 0620) BP: (140-184)/(79-98) 140/79 mmHg (02/14 0620) SpO2:  [91 %-96 %] 91 % (02/14 0620) Weight:  [192 lb (87.091 kg)] 192 lb (87.091 kg) (02/13 1655) Last BM Date: 01/27/13   IS: Pending   Radiology CXR: Pending   General appearance: alert and no distress Resp: rales base - right Cardio: regular rate and rhythm GI: normal findings: bowel sounds normal and soft, non-tender   Assessment/Plan: Fall Multiple right rib fxs -- Aggressive IS today. Suspect CXR will show some RLL ATX Multiple medical problems -- Home meds FEN -- Will give orals for pain today, make sure they work and are tolerated. VTE -- SCD's, Lovenox Dispo -- Possibly home this afternoon if pain controlled    Freeman Caldron, PA-C Pager: [phone] General Trauma PA Pager: [phone]   01/27/2013

## 2013-01-27 NOTE — Progress Notes (Signed)
Discharge patient. Home discharge instruction given, no questions verbalized. 

## 2013-01-28 NOTE — ED Provider Notes (Signed)
Medical screening examination/treatment/procedure(s) were conducted as a shared visit with non-physician practitioner(s) and myself.  I personally evaluated the patient during the encounter  I saw patient and advised admission for his rib fractures   Joya Gaskins, MD 01/28/13 [phone]

## 2013-01-28 NOTE — ED Provider Notes (Signed)
History     CSN: [id number]  Arrival date & time 01/26/13  1015   First MD Initiated Contact with Patient 01/26/13 1025      Chief Complaint  Patient presents with  . Back Pain    (Consider location/radiation/quality/duration/timing/severity/associated sxs/prior treatment) Back Pain Location:  Thoracic spine Quality:  Aching and stabbing Radiates to:  Does not radiate Pain severity:  Moderate Pain is:  Same all the time Onset quality:  Sudden Duration:  18 hours Timing:  Constant Progression:  Worsening Chronicity:  New Relieved by:  Nothing Ineffective treatments:  None tried Associated symptoms: abdominal pain and chest pain   Patient is a 72 y.o. male presenting with back pain. The history is provided by the patient. No language interpreter was used.    Past Medical History  Diagnosis Date  . Hypertension   . Acid reflux   . Diverticulosis   . Cancer   . Skin cancer   . Mental disorder     ADHD  . Urination frequency     Past Surgical History  Procedure Laterality Date  . Cholecystectomy  06/30/2012    Procedure: LAPAROSCOPIC CHOLECYSTECTOMY WITH INTRAOPERATIVE CHOLANGIOGRAM;  Surgeon: Emelia Loron, MD;  Location[facility] OR;  Service: General;  Laterality: N/A;  . Right knee      Family History  Problem Relation Age of Onset  . Diabetes Father     History  Substance Use Topics  . Smoking status: Former Smoker -- 0.25 packs/day for 5 years    Types: Cigarettes    Quit date: 12/14/1965  . Smokeless tobacco: Never Used  . Alcohol Use: 2.4 oz/week    4 Shots of liquor per week     Comment: DAILY      Review of Systems  Respiratory: Negative for cough and shortness of breath.   Cardiovascular: Positive for chest pain.  Gastrointestinal: Positive for abdominal pain.  Musculoskeletal: Positive for back pain.  All other systems reviewed and are negative.    Allergies  Review of patient's allergies indicates no known allergies.  Home  Medications   Current Outpatient Rx  Name  Route  Sig  Dispense  Refill  . amLODipine (NORVASC) 5 MG tablet   Oral   Take 5 mg by mouth daily.         Marland Kitchen buPROPion (WELLBUTRIN XL) 300 MG 24 hr tablet   Oral   Take 300 mg by mouth daily.         . chlorhexidine (PERIDEX) 0.12 % solution   Mouth Rinse   15 mLs by Mouth Rinse route at bedtime. To brush denture implants         . darifenacin (ENABLEX) 7.5 MG 24 hr tablet   Oral   Take 7.5 mg by mouth daily.         . finasteride (PROSCAR) 5 MG tablet   Oral   Take 5 mg by mouth daily.         Marland Kitchen glucosamine-chondroitin 500-400 MG tablet   Oral   Take 1 tablet by mouth 2 (two) times daily.         Marland Kitchen losartan (COZAAR) 100 MG tablet   Oral   Take 100 mg by mouth daily.         . Multiple Vitamin (MULTIVITAMIN WITH MINERALS) TABS   Oral   Take 1 tablet by mouth daily.         Marland Kitchen omeprazole (PRILOSEC) 40 MG capsule   Oral   Take  40 mg by mouth daily.         . timolol (BETIMOL) 0.5 % ophthalmic solution   Both Eyes   Place 1 drop into both eyes daily.         Marland Kitchen HYDROcodone-acetaminophen (NORCO) 5-325 MG per tablet   Oral   Take 1-2 tablets by mouth every 4 (four) hours as needed for pain.   60 tablet   0   . naproxen (NAPROSYN) 500 MG tablet   Oral   Take 1 tablet (500 mg total) by mouth 2 (two) times daily with a meal.   60 tablet   1   . traMADol (ULTRAM) 50 MG tablet   Oral   Take 1-2 tablets (50-100 mg total) by mouth every 6 (six) hours.   100 tablet   1     BP 164/86  Pulse 73  Temp(Src) 97.7 F (36.5 C) (Oral)  Resp 18  Ht 5\' 10"  (1.778 m)  Wt 192 lb (87.091 kg)  BMI 27.55 kg/m2  SpO2 92%  Physical Exam  Constitutional: He appears well-developed and well-nourished.  HENT:  Head: Normocephalic.  Right Ear: External ear normal.  Left Ear: External ear normal.  Mouth/Throat: Oropharyngeal exudate present.  Eyes: Conjunctivae and EOM are normal. Pupils are equal, round, and  reactive to light.  Neck: Normal range of motion. Thyromegaly present.  Cardiovascular: Normal rate and normal heart sounds.   Pulmonary/Chest: Effort normal.  Tender right back to palpation,   thororacic spine nontender  Abdominal: Soft.  Musculoskeletal: Normal range of motion.  Neurological: He is alert.  Skin: Skin is warm.  Psychiatric: He has a normal mood and affect.    ED Course  Procedures (including critical care time)  Labs Reviewed  CBC WITH DIFFERENTIAL - Abnormal; Notable for the following:    WBC 11.6 (*)    Neutrophils Relative 81 (*)    Neutro Abs 9.4 (*)    Lymphocytes Relative 10 (*)    All other components within normal limits  BASIC METABOLIC PANEL - Abnormal; Notable for the following:    Glucose, Bld 136 (*)    BUN 33 (*)    GFR calc non Af Amer 85 (*)    All other components within normal limits   Dg Chest Port 1 View  01/27/2013  *RADIOLOGY REPORT*  Clinical Data: Rib fractures  PORTABLE CHEST - 1 VIEW  Comparison: 01/26/2013  Findings: Airspace disease has developed within both lungs, at the left base, and right mid and lower lung zones.  Normal heart size. No pneumothorax.  Displaced right rib fractures are again noted.  IMPRESSION: New bilateral airspace disease as described.  No pneumothorax.   Original Report Authenticated By: Jolaine Click, M.D.      1. Multiple rib fractures, left, closed, initial encounter       MDM  Dr. Bebe Shaggy in to see and examine pt.   Ct scan abdomen, pelvis and chest obtained. Pt has 4 rib fractures.   Pt lives alone.   I spoke to Dr. Janee Morn Trauma Surgeon who will see for admission.        Lonia Skinner [place], [place] 01/26/13 1405  Lonia Skinner [place], [place] 01/28/13 [address] [place], [place] 01/28/13 [phone]

## 2013-01-28 NOTE — ED Provider Notes (Signed)
Medical screening examination/treatment/procedure(s) were conducted as a shared visit with non-physician practitioner(s) and myself.  I personally evaluated the patient during the encounter  This note is incomplete, please see other note for complete chart  Joya Gaskins, MD 01/28/13 1515

## 2013-04-02 ENCOUNTER — Encounter (HOSPITAL_COMMUNITY): Payer: Self-pay | Admitting: Emergency Medicine

## 2013-04-02 ENCOUNTER — Emergency Department (HOSPITAL_COMMUNITY)
Admission: EM | Admit: 2013-04-02 | Discharge: 2013-04-02 | Disposition: A | Discharge status: Home / Self Care | Payer: Medicare Other | Attending: Emergency Medicine | Admitting: Emergency Medicine

## 2013-04-02 DIAGNOSIS — Z79899 Other long term (current) drug therapy: Secondary | ICD-10-CM | POA: Insufficient documentation

## 2013-04-02 DIAGNOSIS — F909 Attention-deficit hyperactivity disorder, unspecified type: Secondary | ICD-10-CM | POA: Insufficient documentation

## 2013-04-02 DIAGNOSIS — K219 Gastro-esophageal reflux disease without esophagitis: Secondary | ICD-10-CM | POA: Insufficient documentation

## 2013-04-02 DIAGNOSIS — Z87448 Personal history of other diseases of urinary system: Secondary | ICD-10-CM | POA: Insufficient documentation

## 2013-04-02 DIAGNOSIS — R6883 Chills (without fever): Secondary | ICD-10-CM | POA: Insufficient documentation

## 2013-04-02 DIAGNOSIS — D72829 Elevated white blood cell count, unspecified: Secondary | ICD-10-CM | POA: Insufficient documentation

## 2013-04-02 DIAGNOSIS — Z9089 Acquired absence of other organs: Secondary | ICD-10-CM | POA: Insufficient documentation

## 2013-04-02 DIAGNOSIS — K5732 Diverticulitis of large intestine without perforation or abscess without bleeding: Secondary | ICD-10-CM

## 2013-04-02 DIAGNOSIS — Z87891 Personal history of nicotine dependence: Secondary | ICD-10-CM | POA: Insufficient documentation

## 2013-04-02 DIAGNOSIS — R197 Diarrhea, unspecified: Secondary | ICD-10-CM | POA: Insufficient documentation

## 2013-04-02 DIAGNOSIS — I10 Essential (primary) hypertension: Secondary | ICD-10-CM | POA: Insufficient documentation

## 2013-04-02 DIAGNOSIS — Z85828 Personal history of other malignant neoplasm of skin: Secondary | ICD-10-CM | POA: Insufficient documentation

## 2013-04-02 LAB — URINALYSIS, MICROSCOPIC ONLY
Bilirubin Urine: NEGATIVE
Leukocytes, UA: NEGATIVE
Nitrite: NEGATIVE
Specific Gravity, Urine: 1.015 (ref 1.005–1.030)
pH: 7 (ref 5.0–8.0)

## 2013-04-02 LAB — CBC WITH DIFFERENTIAL/PLATELET
Eosinophils Relative: 1 % (ref 0–5)
Lymphocytes Relative: 13 % (ref 12–46)
Lymphs Abs: 1.6 10*3/uL (ref 0.7–4.0)
MCV: 85.9 fL (ref 78.0–100.0)
Platelets: 202 10*3/uL (ref 150–400)
RBC: 4.95 MIL/uL (ref 4.22–5.81)
WBC: 12 10*3/uL — ABNORMAL HIGH (ref 4.0–10.5)

## 2013-04-02 LAB — COMPREHENSIVE METABOLIC PANEL
Albumin: 4.4 g/dL (ref 3.5–5.2)
BUN: 28 mg/dL — ABNORMAL HIGH (ref 6–23)
Chloride: 97 mEq/L (ref 96–112)
Creatinine, Ser: 0.82 mg/dL (ref 0.50–1.35)
Total Bilirubin: 1.4 mg/dL — ABNORMAL HIGH (ref 0.3–1.2)
Total Protein: 7.5 g/dL (ref 6.0–8.3)

## 2013-04-02 LAB — LIPASE, BLOOD: Lipase: 17 U/L (ref 11–59)

## 2013-04-02 MED ORDER — CIPROFLOXACIN HCL 500 MG PO TABS
500.0000 mg | ORAL_TABLET | Freq: Once | ORAL | Status: AC
  Administered 2013-04-02: 500 mg via ORAL
  Filled 2013-04-02: qty 1

## 2013-04-02 MED ORDER — METRONIDAZOLE 500 MG PO TABS: 500.0000 mg | ORAL_TABLET | Freq: Three times a day (TID) | ORAL | Status: AC

## 2013-04-02 MED ORDER — CIPROFLOXACIN HCL 500 MG PO TABS: 500.0000 mg | ORAL_TABLET | Freq: Two times a day (BID) | ORAL | Status: DC

## 2013-04-02 MED ORDER — METRONIDAZOLE 500 MG PO TABS
500.0000 mg | ORAL_TABLET | Freq: Once | ORAL | Status: AC
  Administered 2013-04-02: 500 mg via ORAL
  Filled 2013-04-02: qty 1

## 2013-04-02 NOTE — ED Provider Notes (Signed)
See prior note   Ward Givens, MD 04/02/13 1655

## 2013-04-02 NOTE — ED Notes (Signed)
Pt c/o lower abd pain and diarrhea x 2 days; pt sts hx of diverticulosis and sts feels same

## 2013-04-02 NOTE — ED Provider Notes (Signed)
History     CSN: [id number]  Arrival date & time 04/02/13  1439   First MD Initiated Contact with Patient 04/02/13 1551      Chief Complaint  Patient presents with  . Abdominal Pain  . Diarrhea    (Consider location/radiation/quality/duration/timing/severity/associated sxs/prior treatment) Patient is a 72 y.o. male presenting with abdominal pain and diarrhea. The history is provided by the patient.  Abdominal Pain Pain location:  LLQ Pain quality: aching   Pain radiates to:  Does not radiate Pain severity:  Mild Associated symptoms: chills and diarrhea   Associated symptoms: no chest pain, no dysuria, no fever, no hematuria, no shortness of breath and no vomiting   Associated symptoms comment:  LLQ abdominal cramping that started yesterday along with 4-5 bowel movements. No fever, N, V. His bowel movements have been non-bloody. He has had diverticulitis in the past and reports the current symptoms are similar.No dysuria, SOB, cough, chest pain. Diarrhea Associated symptoms: abdominal pain and chills   Associated symptoms: no fever, no myalgias and no vomiting     Past Medical History  Diagnosis Date  . Hypertension   . Acid reflux   . Diverticulosis   . Cancer   . Skin cancer   . Mental disorder     ADHD  . Urination frequency     Past Surgical History  Procedure Laterality Date  . Cholecystectomy  06/30/2012    Procedure: LAPAROSCOPIC CHOLECYSTECTOMY WITH INTRAOPERATIVE CHOLANGIOGRAM;  Surgeon: Emelia Loron, MD;  Location: [facility] OR;  Service: General;  Laterality: N/A;  . Right knee      Family History  Problem Relation Age of Onset  . Diabetes Father     History  Substance Use Topics  . Smoking status: Former Smoker -- 0.25 packs/day for 5 years    Types: Cigarettes    Quit date: 12/14/1965  . Smokeless tobacco: Never Used  . Alcohol Use: 2.4 oz/week    4 Shots of liquor per week     Comment: DAILY      Review of Systems  Constitutional: Positive  for chills. Negative for fever.  Respiratory: Negative for shortness of breath.   Cardiovascular: Negative for chest pain.  Gastrointestinal: Positive for abdominal pain and diarrhea. Negative for vomiting.  Genitourinary: Negative for dysuria, hematuria and flank pain.  Musculoskeletal: Negative for myalgias.  Skin: Negative for rash.  Psychiatric/Behavioral: Negative for confusion.    Allergies  Review of patient's allergies indicates no known allergies.  Home Medications   Current Outpatient Rx  Name  Route  Sig  Dispense  Refill  . amLODipine (NORVASC) 5 MG tablet   Oral   Take 5 mg by mouth daily.         Marland Kitchen buPROPion (WELLBUTRIN XL) 300 MG 24 hr tablet   Oral   Take 300 mg by mouth daily.         . chlorhexidine (PERIDEX) 0.12 % solution   Mouth Rinse   15 mLs by Mouth Rinse route at bedtime. To brush denture implants         . darifenacin (ENABLEX) 7.5 MG 24 hr tablet   Oral   Take 7.5 mg by mouth daily.         . finasteride (PROSCAR) 5 MG tablet   Oral   Take 5 mg by mouth daily.         Marland Kitchen glucosamine-chondroitin 500-400 MG tablet   Oral   Take 1 tablet by mouth 2 (two) times daily.         Marland Kitchen  HYDROcodone-acetaminophen (NORCO) 5-325 MG per tablet   Oral   Take 1-2 tablets by mouth every 4 (four) hours as needed for pain.   60 tablet   0   . losartan (COZAAR) 100 MG tablet   Oral   Take 100 mg by mouth daily.         . Multiple Vitamin (MULTIVITAMIN WITH MINERALS) TABS   Oral   Take 1 tablet by mouth daily.         . naproxen (NAPROSYN) 500 MG tablet   Oral   Take 1 tablet (500 mg total) by mouth 2 (two) times daily with a meal.   60 tablet   1   . omeprazole (PRILOSEC) 40 MG capsule   Oral   Take 40 mg by mouth daily.         . timolol (BETIMOL) 0.5 % ophthalmic solution   Both Eyes   Place 1 drop into both eyes daily.         . traMADol (ULTRAM) 50 MG tablet   Oral   Take 1-2 tablets (50-100 mg total) by mouth every 6  (six) hours.   100 tablet   1     BP 138/102  Pulse 92  Temp(Src) 98.4 F (36.9 C) (Oral)  Resp 18  SpO2 100%  Physical Exam  Constitutional: He is oriented to person, place, and time. He appears well-developed and well-nourished. No distress.  HENT:  Mouth/Throat: Oropharynx is clear and moist.  Neck: Normal range of motion.  Cardiovascular: Normal rate.   No murmur heard. Pulmonary/Chest: Effort normal. He has no wheezes. He has no rales.  Abdominal: Soft.  Minimally tender to LLQ without guarding or rebound tenderness. Abdomen soft.  Neurological: He is alert and oriented to person, place, and time.  Skin: Skin is warm and dry.  Psychiatric: He has a normal mood and affect.    ED Course  Procedures (including critical care time)  Labs Reviewed  COMPREHENSIVE METABOLIC PANEL - Abnormal; Notable for the following:    Sodium 133 (*)    Glucose, Bld 104 (*)    BUN 28 (*)    Total Bilirubin 1.4 (*)    GFR calc non Af Amer 87 (*)    All other components within normal limits  CBC WITH DIFFERENTIAL - Abnormal; Notable for the following:    WBC 12.0 (*)    Neutrophils Relative 81 (*)    Neutro Abs 9.6 (*)    All other components within normal limits  LIPASE, BLOOD  URINALYSIS, MICROSCOPIC ONLY   Results for orders placed during the hospital encounter of 04/02/13  COMPREHENSIVE METABOLIC PANEL      Result Value Range   Sodium 133 (*) 135 - 145 mEq/L   Potassium 4.0  3.5 - 5.1 mEq/L   Chloride 97  96 - 112 mEq/L   CO2 27  19 - 32 mEq/L   Glucose, Bld 104 (*) 70 - 99 mg/dL   BUN 28 (*) 6 - 23 mg/dL   Creatinine, Ser 5.64  0.50 - 1.35 mg/dL   Calcium 9.9  8.4 - 33.2 mg/dL   Total Protein 7.5  6.0 - 8.3 g/dL   Albumin 4.4  3.5 - 5.2 g/dL   AST 24  0 - 37 U/L   ALT 20  0 - 53 U/L   Alkaline Phosphatase 95  39 - 117 U/L   Total Bilirubin 1.4 (*) 0.3 - 1.2 mg/dL   GFR calc non Af [place]se Dago  87 (*) >90 mL/min   GFR calc Af Amer >90  >90 mL/min  CBC WITH DIFFERENTIAL       Result Value Range   WBC 12.0 (*) 4.0 - 10.5 K/uL   RBC 4.95  4.22 - 5.81 MIL/uL   Hemoglobin 15.2  13.0 - 17.0 g/dL   HCT 52.8  41.3 - 24.4 %   MCV 85.9  78.0 - 100.0 fL   MCH 30.7  26.0 - 34.0 pg   MCHC 35.8  30.0 - 36.0 g/dL   RDW 01.0  27.2 - 53.6 %   Platelets 202  150 - 400 K/uL   Neutrophils Relative 81 (*) 43 - 77 %   Neutro Abs 9.6 (*) 1.7 - 7.7 K/uL   Lymphocytes Relative 13  12 - 46 %   Lymphs Abs 1.6  0.7 - 4.0 K/uL   Monocytes Relative 5  3 - 12 %   Monocytes Absolute 0.7  0.1 - 1.0 K/uL   Eosinophils Relative 1  0 - 5 %   Eosinophils Absolute 0.1  0.0 - 0.7 K/uL   Basophils Relative 0  0 - 1 %   Basophils Absolute 0.0  0.0 - 0.1 K/uL  LIPASE, BLOOD      Result Value Range   Lipase 17  11 - 59 U/L  URINALYSIS, MICROSCOPIC ONLY      Result Value Range   Color, Urine YELLOW  YELLOW   APPearance CLEAR  CLEAR   Specific Gravity, Urine 1.015  1.005 - 1.030   pH 7.0  5.0 - 8.0   Glucose, UA NEGATIVE  NEGATIVE mg/dL   Hgb urine dipstick NEGATIVE  NEGATIVE   Bilirubin Urine NEGATIVE  NEGATIVE   Ketones, ur NEGATIVE  NEGATIVE mg/dL   Protein, ur NEGATIVE  NEGATIVE mg/dL   Urobilinogen, UA 0.2  0.0 - 1.0 mg/dL   Nitrite NEGATIVE  NEGATIVE   Leukocytes, UA NEGATIVE  NEGATIVE   WBC, UA 0-2  <3 WBC/hpf   RBC / HPF 0-2  <3 RBC/hpf   Bacteria, UA RARE  RARE   Urine-Other AMORPHOUS URATES/PHOSPHATES      No results found.   No diagnosis found.  1. Diverticulitis   MDM  Patient is stable, labs show mild leukocytosis but otherwise unremarkable. Feel recurrent, mild diverticulitis is most likely and CT scan is not necessary. Discussed with Dr. Lynelle Doctor.         Arnoldo Hooker, PA-C 04/02/13 1650

## 2013-04-02 NOTE — ED Provider Notes (Signed)
Pt relates hx of and was admitted in 2005. He had another episode in 2009 that was treated as an outpatient. He states this morning he started having some suprapubic abdominal pain which she relates is where his diverticulitis pain is always located. He started having diarrhea yesterday and does not have nausea or vomiting. He's not sure of fever but states he has had some chills.  Patient is pleasant and alert. His abdomen is soft with minor tenderness to palpation over the suprapubic area. There is no guarding or rebound noted. Patient's color is good with mild flushing of his face.   At this point I do not suspect complication of diverticulitis. At this point I do not feel CT scan is indicated. Patient has known disease and has similar symptoms to what he has had in the past.Patient has no rectal bleeding or severe abdominal pain by complaint or to palpation. He can always return and we discussed to return if he gets fever or vomiting rectal bleeding or his pain gets worse.  Medical screening examination/treatment/procedure(s) were conducted as a shared visit with non-physician practitioner(s) and myself.  I personally evaluated the patient during the encounter Devoria Albe, MD, Franz Dell, MD 04/02/13 [phone]

## 2014-04-20 IMAGING — US US ABDOMEN COMPLETE
1 series · 14 of 25 positions shown · non-contrast
Comparison: None.

CLINICAL DATA: Abdominal pain.

ABDOMINAL ULTRASOUND COMPLETE

[Series 1: us abdomen complete · 0.28mm/px · 14 of 59 slices shown]
[im 1/59]
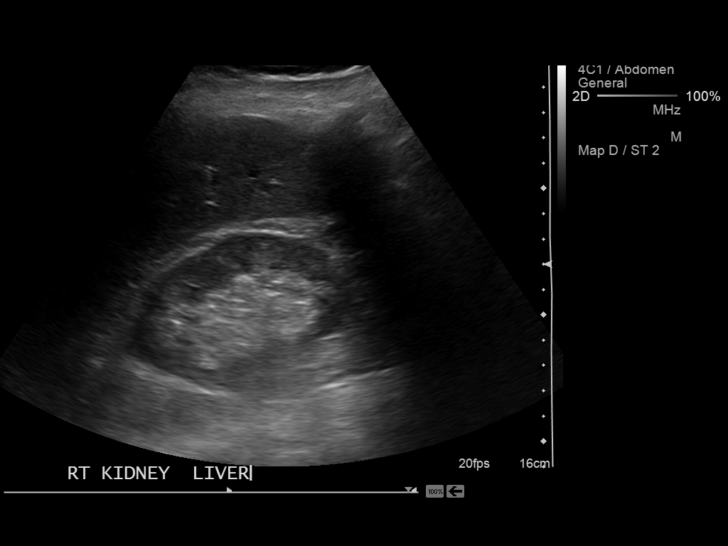
[im 5/59]
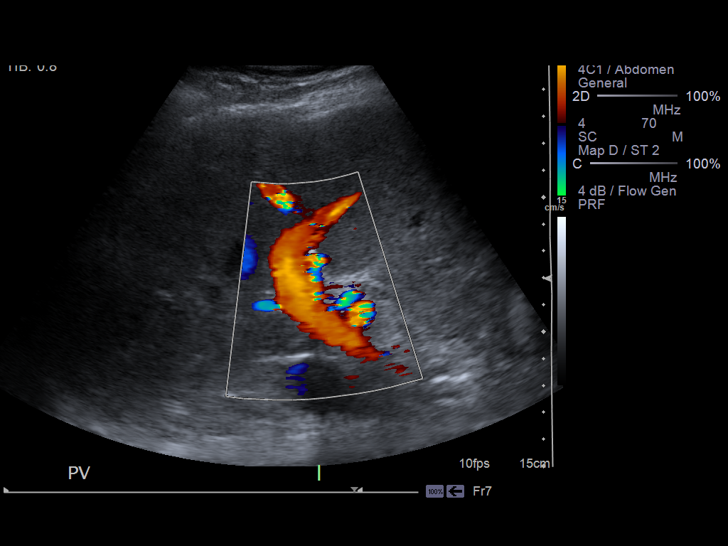
[im 10/59]
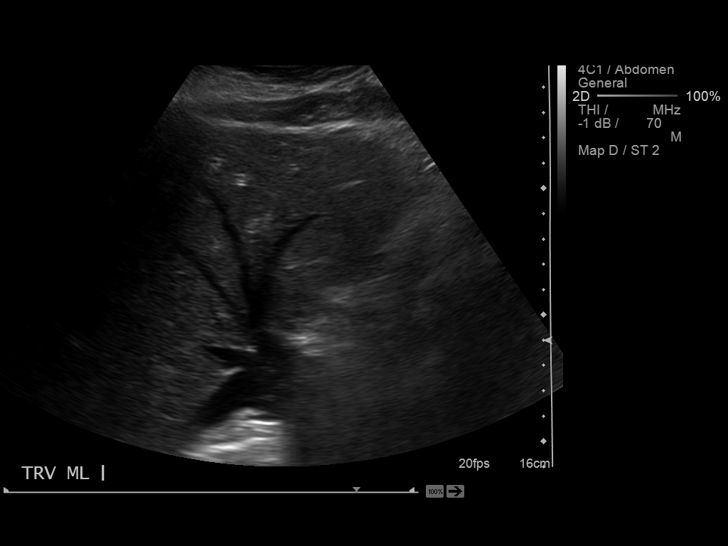
[im 15/59]
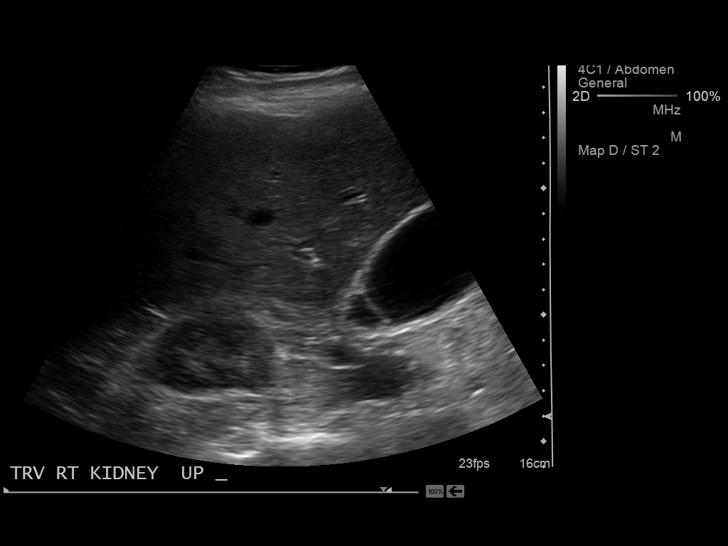
[im 20/59]
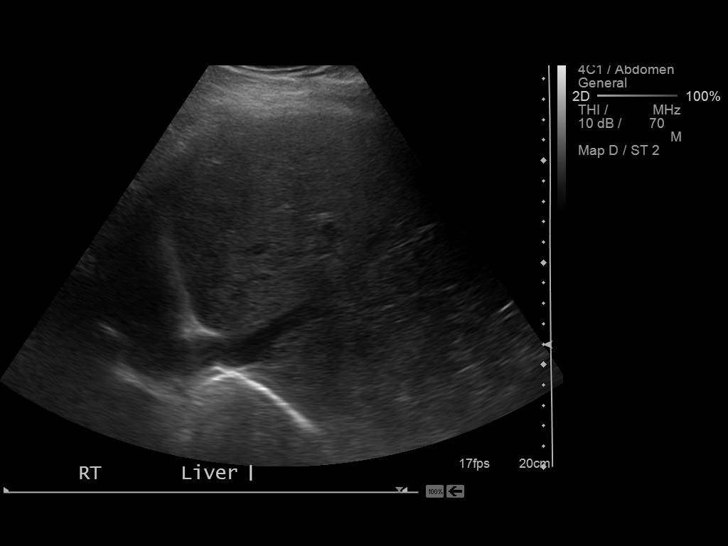
[im 22/59]
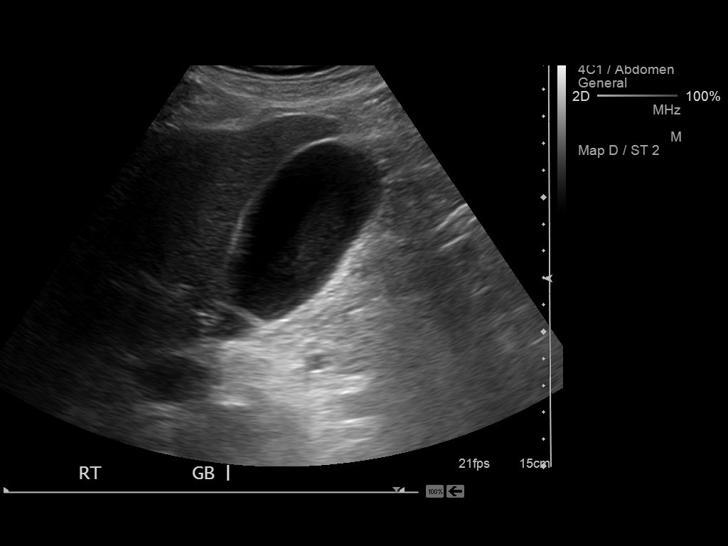
[im 27/59]
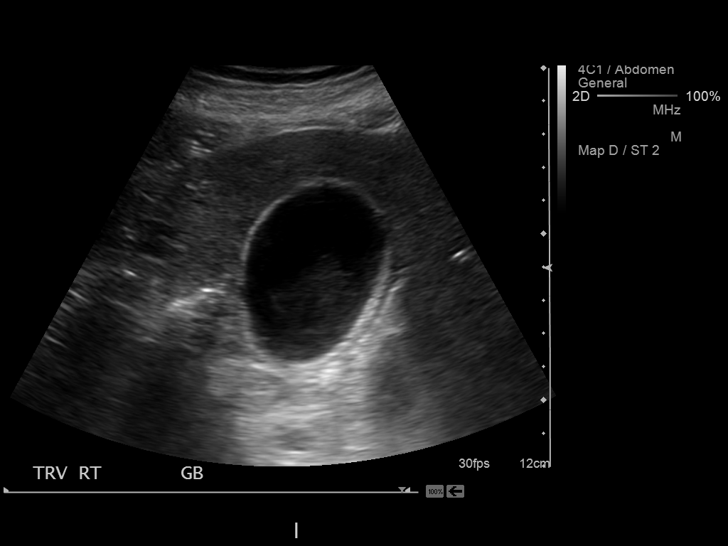
[im 32/59]
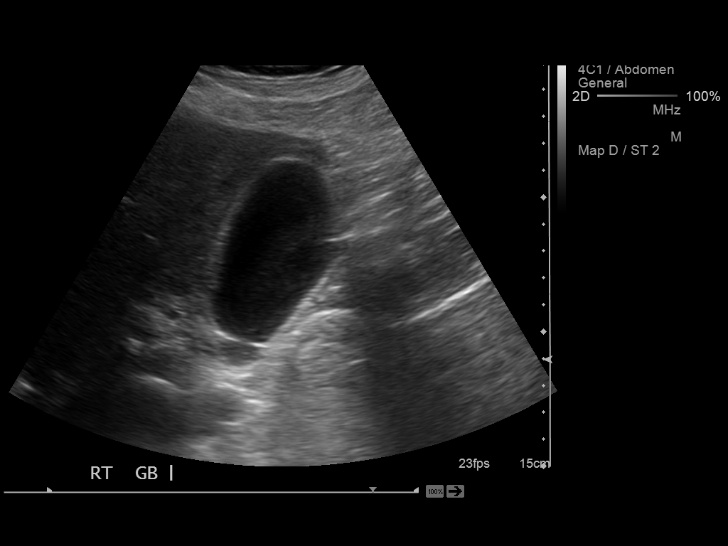
[im 37/59]
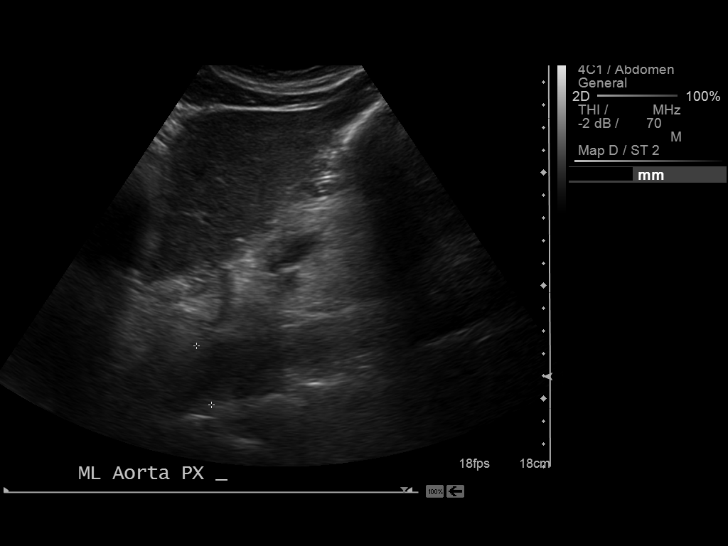
[im 39/59]
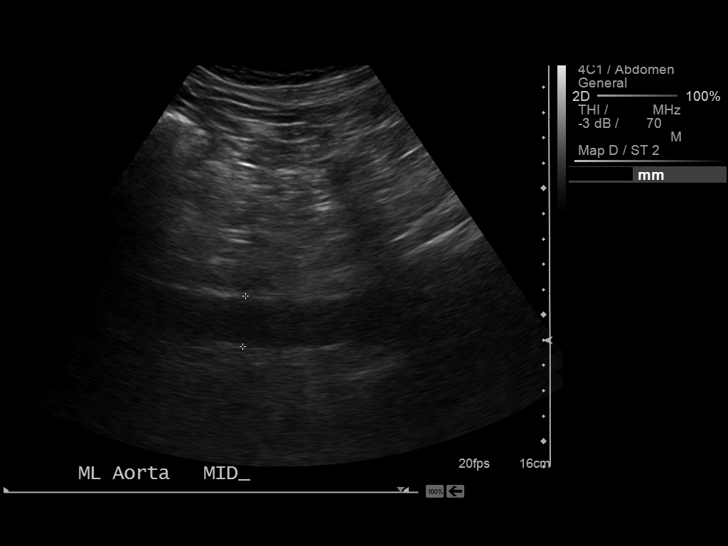
[im 44/59]
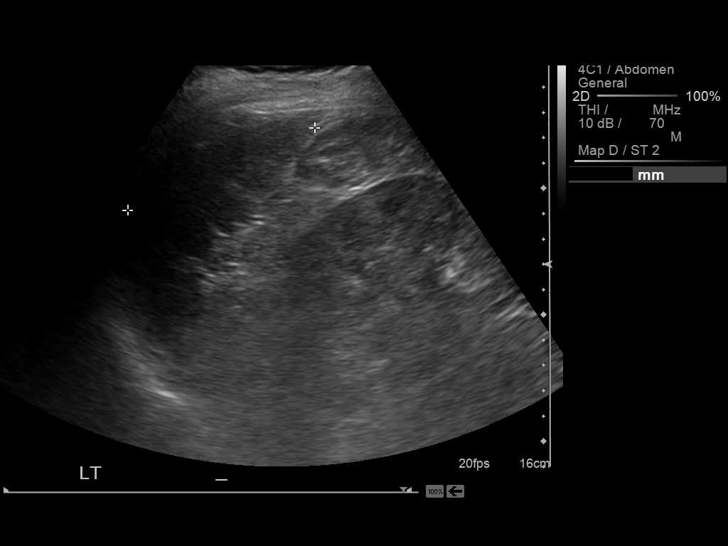
[im 49/59]
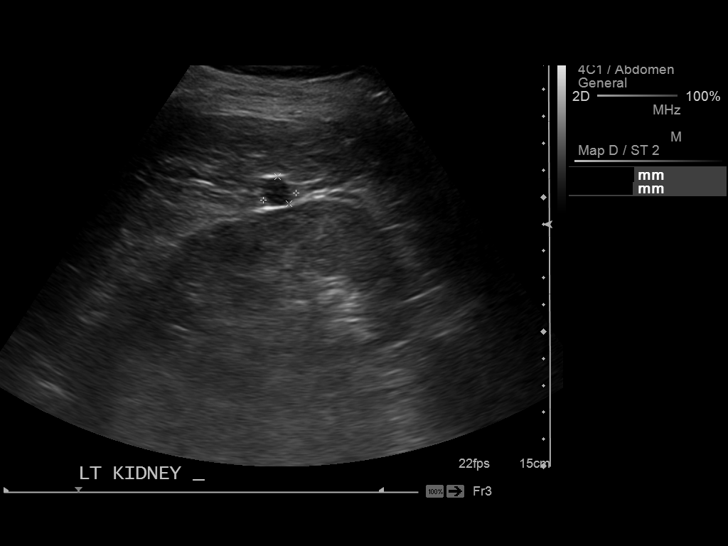
[im 54/59]
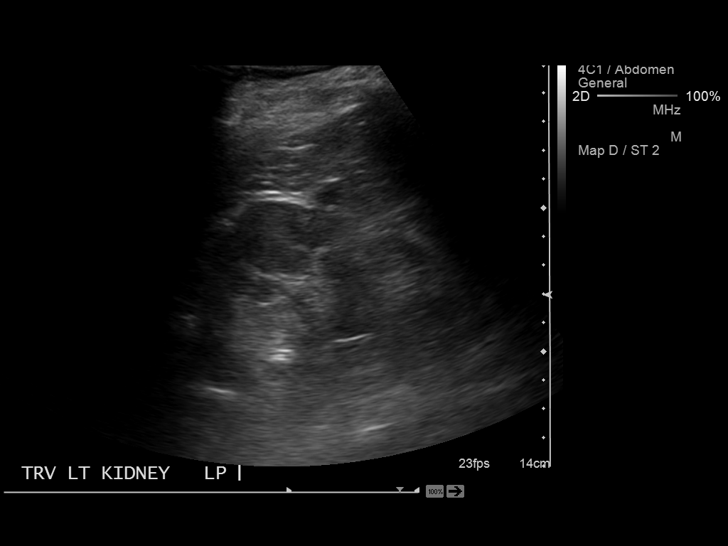
[im 59/59]
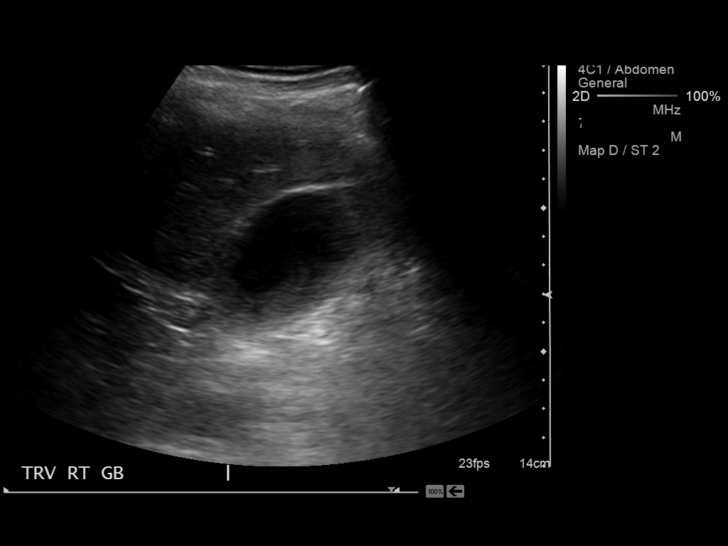

[14 of 25 positions shown; findings below may reference images not displayed]

FINDINGS: Gallbladder:  Layering sludge seen within the gallbladder, however
no discrete gallstones identified.  No evidence of gallbladder wall
thickening or pericholecystic fluid.

Common Bile Duct:  Upper limits of normal measuring 6 mm in
diameter.

Liver: No focal mass lesion identified.  Within normal limits in
parenchymal echogenicity.

IVC:  Appears normal.

Pancreas:  No abnormality identified.

Spleen:  Within normal limits in size and echotexture.

Right kidney:  Normal in size and parenchymal echogenicity.  No
evidence of mass or hydronephrosis.

Left kidney:  Normal in size and parenchymal echogenicity.  No
evidence of mass or hydronephrosis.Tiny subcapsular cyst seen in
the mid pole measuring 1.2 cm.

Abdominal Aorta:  No aneurysm identified.
IMPRESSION: 1.  Gallbladder sludge, withoutevidence of discrete gallstones,
gallbladder wall thickening, or biliary dilatation.
2.  Tiny left renal cyst.  No evidence of hydronephrosis.

## 2014-04-24 IMAGING — RF DG CHOLANGIOGRAM OPERATIVE
1 series · 5 of 5 positions shown · non-contrast
Comparison: 06/26/2012

CLINICAL DATA: Laparoscopic cholecystectomy.

INTRAOPERATIVE CHOLANGIOGRAM

[Series 1: run · 2 acquisitions, 5 frames shown]
[im 1/2]
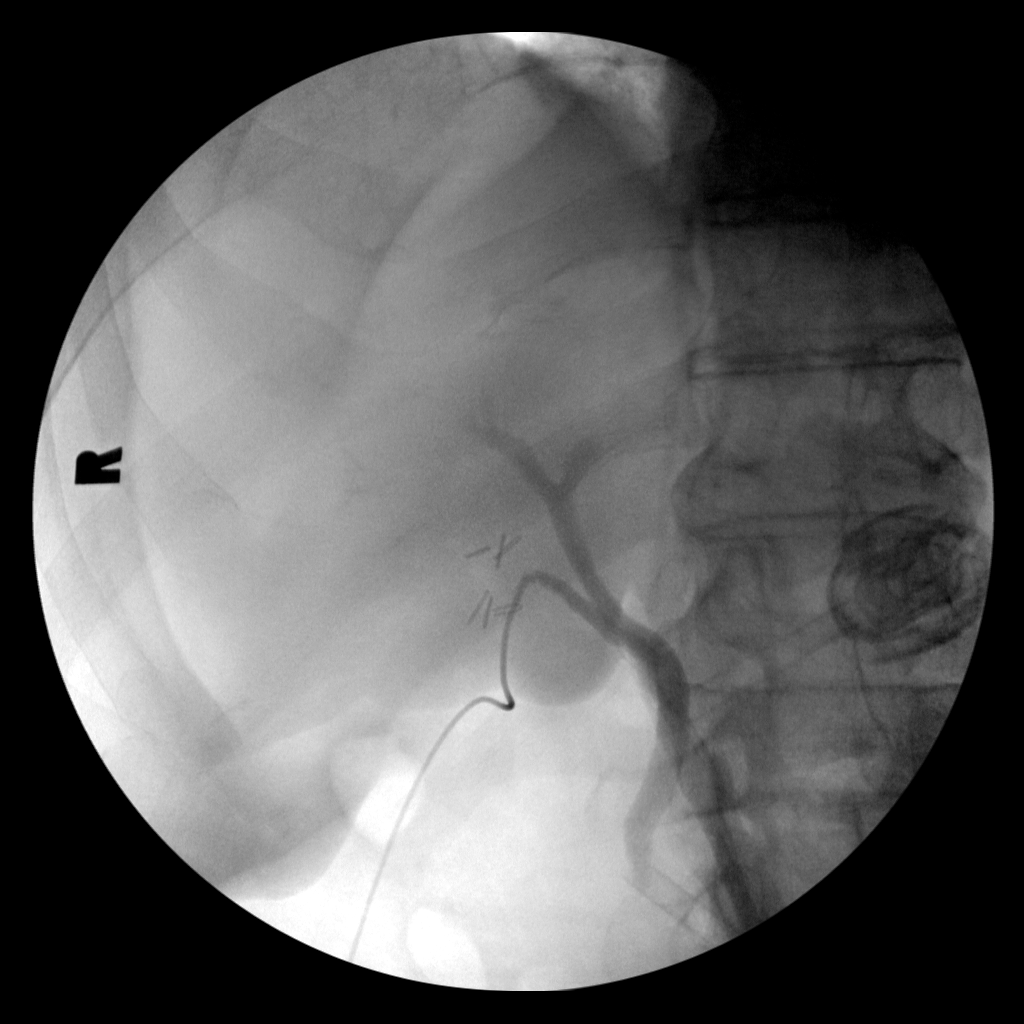
[im 1/2]
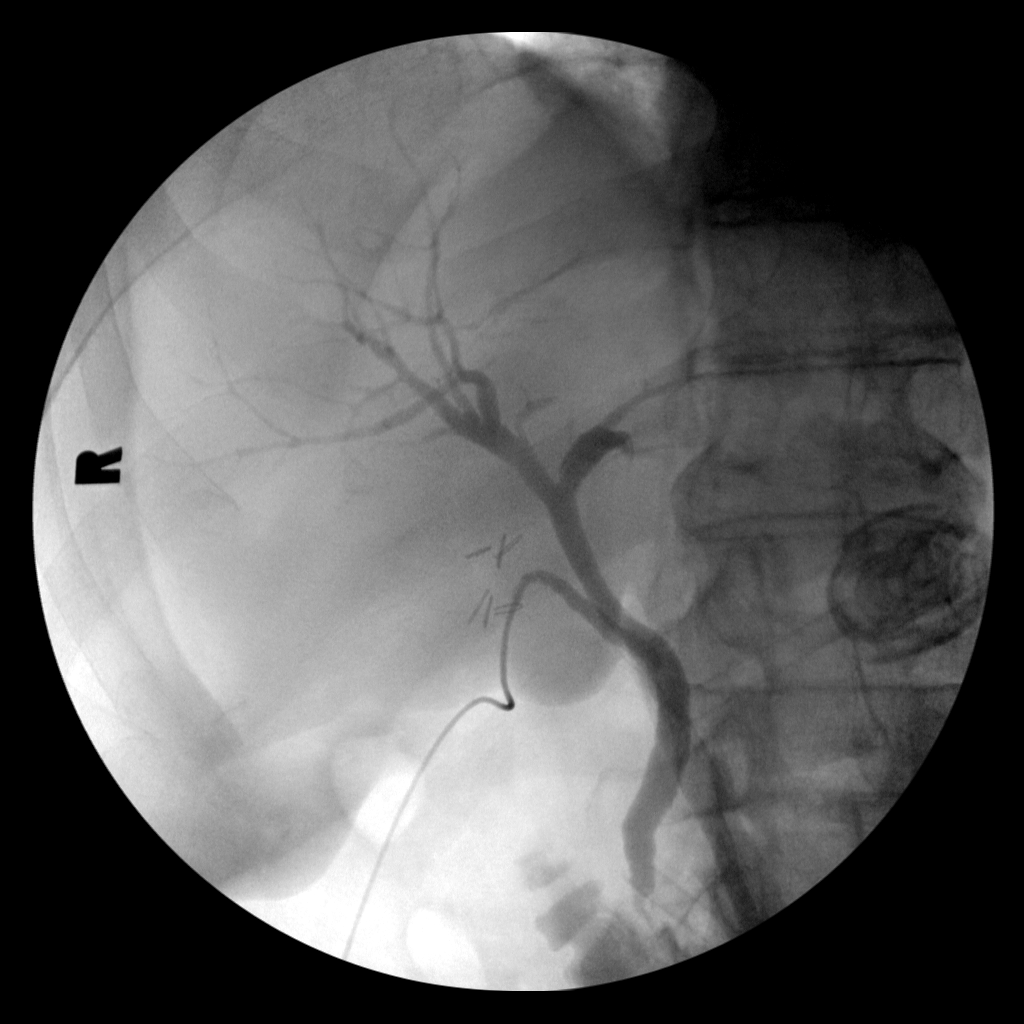
[im 1/2]
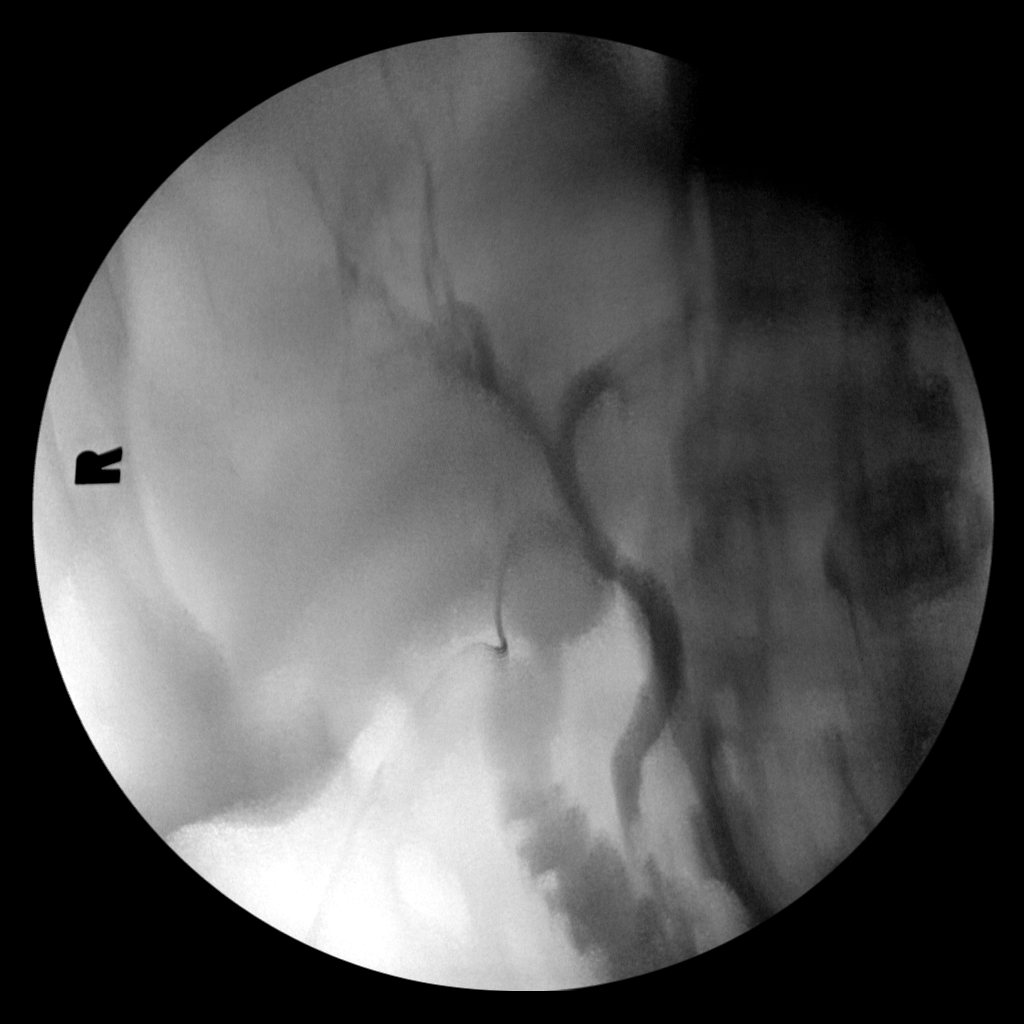
[im 1/2]
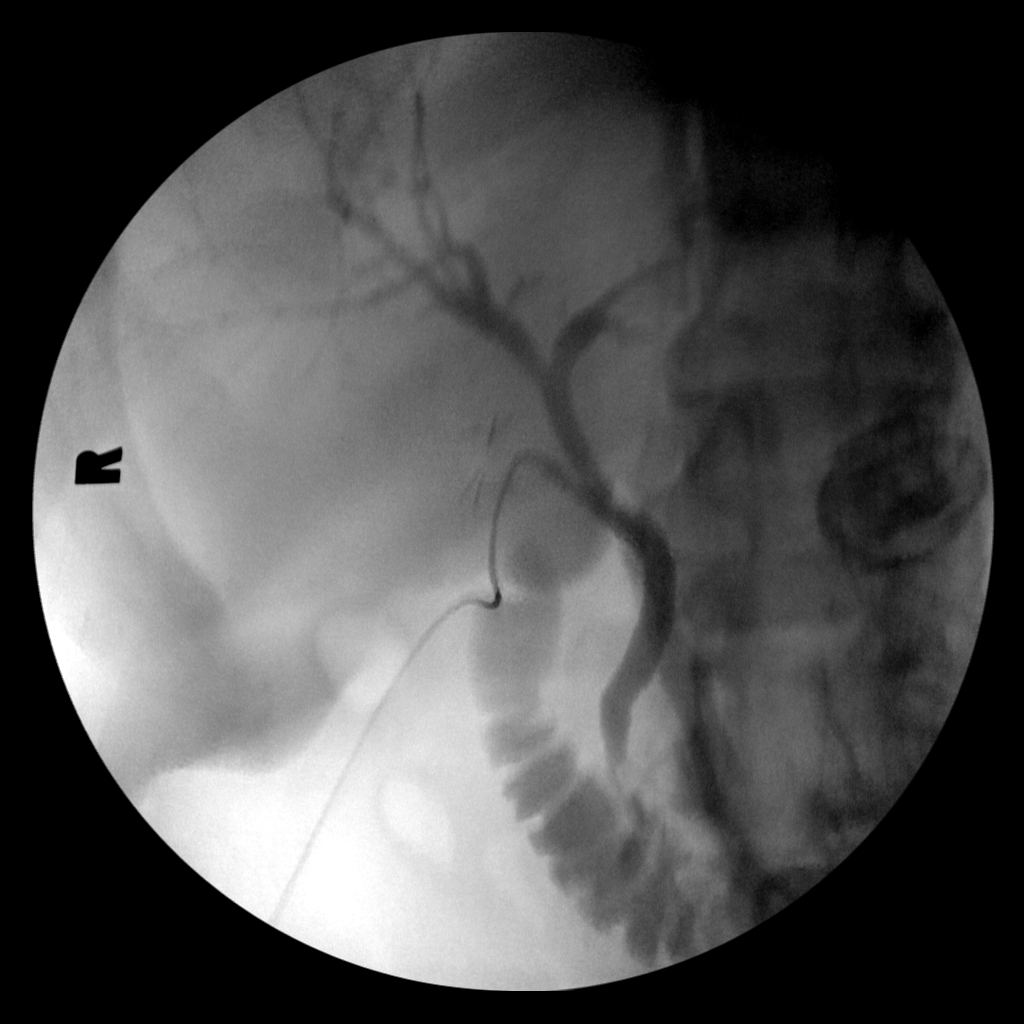
[im 2/2]
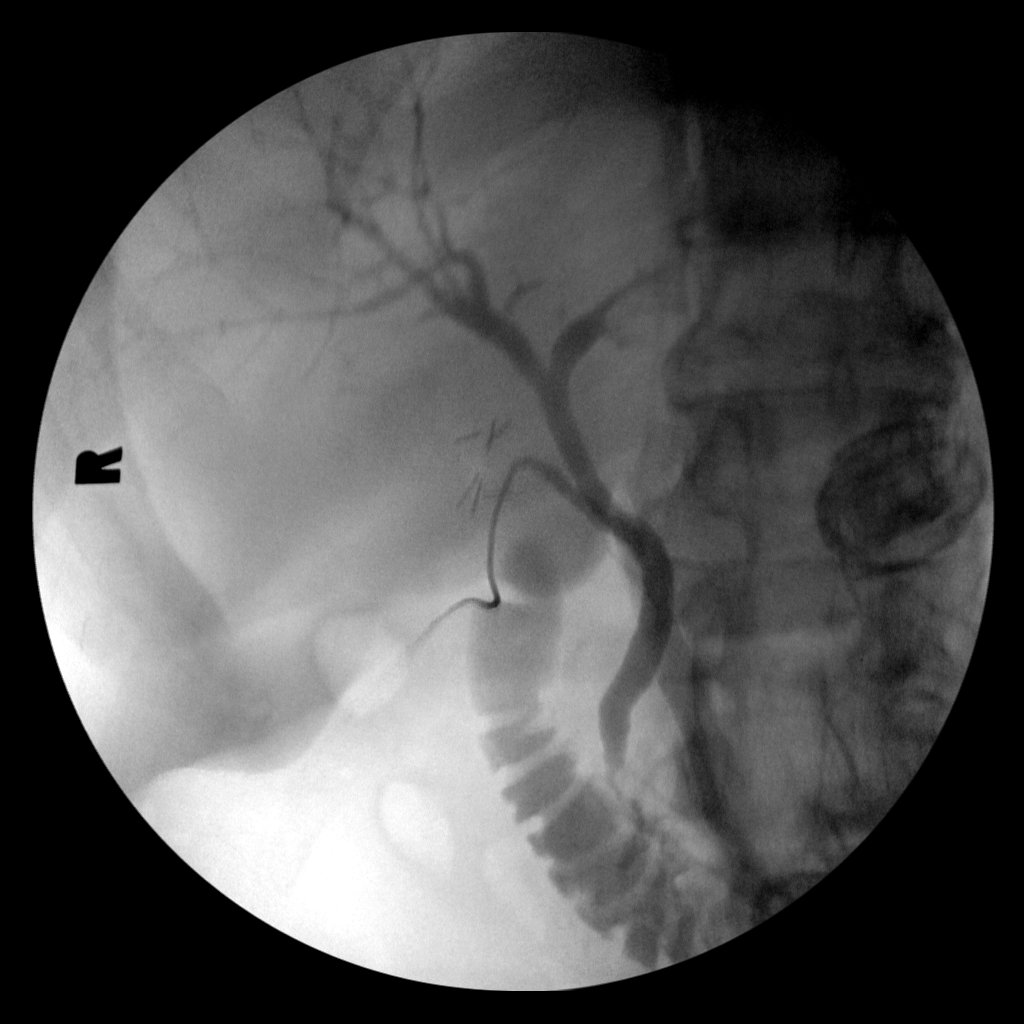

[5 of 5 positions shown; findings below may reference images not displayed]

FINDINGS: Intraoperative spot images show normal caliber biliary
system.  No evidence of retained stone or obstruction.  Free
passage of contrast into the small bowel noted.
IMPRESSION: No evidence of retained stone or obstruction.

These images were submitted for radiologic interpretation only.
Please see the procedural report for the amount of contrast and the
fluoroscopy time utilized.

## 2014-12-04 ENCOUNTER — Encounter (HOSPITAL_COMMUNITY): Payer: Self-pay

## 2014-12-04 ENCOUNTER — Emergency Department (HOSPITAL_COMMUNITY)
Admission: EM | Admit: 2014-12-04 | Discharge: 2014-12-04 | Disposition: A | Discharge status: Home / Self Care | Payer: Medicare Other | Attending: Emergency Medicine | Admitting: Emergency Medicine

## 2014-12-04 ENCOUNTER — Emergency Department (HOSPITAL_COMMUNITY): Payer: Medicare Other

## 2014-12-04 DIAGNOSIS — Z791 Long term (current) use of non-steroidal anti-inflammatories (NSAID): Secondary | ICD-10-CM | POA: Insufficient documentation

## 2014-12-04 DIAGNOSIS — Y9389 Activity, other specified: Secondary | ICD-10-CM | POA: Insufficient documentation

## 2014-12-04 DIAGNOSIS — Z792 Long term (current) use of antibiotics: Secondary | ICD-10-CM | POA: Insufficient documentation

## 2014-12-04 DIAGNOSIS — K219 Gastro-esophageal reflux disease without esophagitis: Secondary | ICD-10-CM | POA: Insufficient documentation

## 2014-12-04 DIAGNOSIS — Z85828 Personal history of other malignant neoplasm of skin: Secondary | ICD-10-CM | POA: Insufficient documentation

## 2014-12-04 DIAGNOSIS — R109 Unspecified abdominal pain: Secondary | ICD-10-CM

## 2014-12-04 DIAGNOSIS — Y9289 Other specified places as the place of occurrence of the external cause: Secondary | ICD-10-CM | POA: Diagnosis not present

## 2014-12-04 DIAGNOSIS — Z79899 Other long term (current) drug therapy: Secondary | ICD-10-CM | POA: Diagnosis not present

## 2014-12-04 DIAGNOSIS — W01198A Fall on same level from slipping, tripping and stumbling with subsequent striking against other object, initial encounter: Secondary | ICD-10-CM | POA: Diagnosis not present

## 2014-12-04 DIAGNOSIS — I10 Essential (primary) hypertension: Secondary | ICD-10-CM | POA: Diagnosis not present

## 2014-12-04 DIAGNOSIS — S3991XA Unspecified injury of abdomen, initial encounter: Secondary | ICD-10-CM | POA: Diagnosis present

## 2014-12-04 DIAGNOSIS — S2232XA Fracture of one rib, left side, initial encounter for closed fracture: Secondary | ICD-10-CM | POA: Diagnosis not present

## 2014-12-04 DIAGNOSIS — Z87891 Personal history of nicotine dependence: Secondary | ICD-10-CM | POA: Insufficient documentation

## 2014-12-04 DIAGNOSIS — Y998 Other external cause status: Secondary | ICD-10-CM | POA: Diagnosis not present

## 2014-12-04 MED ORDER — HYDROCODONE-ACETAMINOPHEN 5-325 MG PO TABS: 1.0000 | ORAL_TABLET | Freq: Four times a day (QID) | ORAL | Status: AC | PRN

## 2014-12-04 MED ORDER — HYDROCODONE-ACETAMINOPHEN 5-325 MG PO TABS
1.0000 | ORAL_TABLET | Freq: Once | ORAL | Status: AC
  Administered 2014-12-04: 1 via ORAL
  Filled 2014-12-04: qty 1

## 2014-12-04 NOTE — ED Provider Notes (Signed)
CSN: [id number]     Arrival date & time 12/04/14  1004 History   First MD Initiated Contact with Patient 12/04/14 1011     Chief Complaint  Patient presents with  . Fall     (Consider location/radiation/quality/duration/timing/severity/associated sxs/prior Treatment) HPI  Felicia Maners is a 73 y.o. male complaining of left flank pain status post slip and fall yesterday. Initially the pain was not terribly severe but it is increased overnight and is not relieved by naproxen, pain is exacerbated by movement, laying supine and deep breathing. He denies shortness of breath, head trauma, cervicalgia, abdominal pain, difficulty ambulating.  Past Medical History  Diagnosis Date  . Hypertension   . Acid reflux   . Diverticulosis   . Cancer   . Skin cancer   . Mental disorder     ADHD  . Urination frequency    Past Surgical History  Procedure Laterality Date  . Cholecystectomy  06/30/2012    Procedure: LAPAROSCOPIC CHOLECYSTECTOMY WITH INTRAOPERATIVE CHOLANGIOGRAM;  Surgeon: Rolm Bookbinder, MD;  Location: [facility];  Service: General;  Laterality: N/A;  . Right knee     Family History  Problem Relation Age of Onset  . Diabetes Father    History  Substance Use Topics  . Smoking status: Former Smoker -- 0.25 packs/day for 5 years    Types: Cigarettes    Quit date: 12/14/1965  . Smokeless tobacco: Never Used  . Alcohol Use: 2.4 oz/week    4 Shots of liquor per week     Comment: DAILY    Review of Systems  10 systems reviewed and found to be negative, except as noted in the HPI.   Allergies  Review of patient's allergies indicates no known allergies.  Home Medications   Prior to Admission medications   Medication Sig Start Date End Date Taking? Authorizing Provider  amLODipine (NORVASC) 5 MG tablet Take 5 mg by mouth daily.    Historical Provider, MD  buPROPion (WELLBUTRIN XL) 300 MG 24 hr tablet Take 300 mg by mouth daily.    Historical Provider, MD  chlorhexidine  (PERIDEX) 0.12 % solution 15 mLs by Mouth Rinse route at bedtime. To brush denture implants 07/26/12   Historical Provider, MD  ciprofloxacin (CIPRO) 500 MG tablet Take 1 tablet (500 mg total) by mouth every 12 (twelve) hours. 04/02/13   Shari A Upstill, PA-C  darifenacin (ENABLEX) 7.5 MG 24 hr tablet Take 7.5 mg by mouth daily.    Historical Provider, MD  finasteride (PROSCAR) 5 MG tablet Take 5 mg by mouth daily.    Historical Provider, MD  glucosamine-chondroitin 500-400 MG tablet Take 1 tablet by mouth 2 (two) times daily.    Historical Provider, MD  HYDROcodone-acetaminophen (NORCO) 5-325 MG per tablet Take 1-2 tablets by mouth every 4 (four) hours as needed for pain. 01/27/13   Lisette Abu, PA-C  losartan (COZAAR) 100 MG tablet Take 100 mg by mouth daily.    Historical Provider, MD  metroNIDAZOLE (FLAGYL) 500 MG tablet Take 1 tablet (500 mg total) by mouth 3 (three) times daily. 04/02/13   Shari A Upstill, PA-C  Multiple Vitamin (MULTIVITAMIN WITH MINERALS) TABS Take 1 tablet by mouth daily.    Historical Provider, MD  naproxen (NAPROSYN) 500 MG tablet Take 1 tablet (500 mg total) by mouth 2 (two) times daily with a meal. 01/27/13   Lisette Abu, PA-C  omeprazole (PRILOSEC) 40 MG capsule Take 40 mg by mouth daily.    Historical Provider, MD  timolol (BETIMOL) 0.5 % ophthalmic solution Place 1 drop into both eyes daily.    Historical Provider, MD  traMADol (ULTRAM) 50 MG tablet Take 1-2 tablets (50-100 mg total) by mouth every 6 (six) hours. 01/27/13   Lisette Abu, PA-C   BP 136/98 mmHg  Pulse 55  Temp(Src) 97.6 F (36.4 C) (Oral)  Resp 18  Ht 5' 9.75" (1.772 m)  Wt 192 lb (87.091 kg)  BMI 27.74 kg/m2  SpO2 98% Physical Exam  Constitutional: He is oriented to person, place, and time. He appears well-developed and well-nourished. No distress.  HENT:  Head: Normocephalic.  Eyes: Conjunctivae and EOM are normal. Pupils are equal, round, and reactive to light.  Neck: Normal  range of motion.  No midline C-spine  tenderness to palpation or step-offs appreciated. Patient has full range of motion without pain.   Cardiovascular: Normal rate, regular rhythm and intact distal pulses.   Pulmonary/Chest: Effort normal and breath sounds normal. No stridor. No respiratory distress. He has no wheezes. He has no rales. He exhibits tenderness.  Abdominal: Soft. Bowel sounds are normal. He exhibits no distension and no mass. There is no tenderness. There is no rebound and no guarding.  Musculoskeletal: Normal range of motion.       Thoracic back: He exhibits bony tenderness. He exhibits no laceration.       Back:  No by overlaying bruises or erythema. Patient is point tender to palpation, lung sounds are clear to auscultation. No crepitance  Neurological: He is alert and oriented to person, place, and time.  Psychiatric: He has a normal mood and affect.  Nursing note and vitals reviewed.   ED Course  Procedures (including critical care time) Labs Review Labs Reviewed  URINALYSIS, ROUTINE W REFLEX MICROSCOPIC    Imaging Review No results found.   EKG Interpretation None      MDM   Final diagnoses:  Acute left flank pain    Filed Vitals:   12/04/14 1022 12/04/14 1100  BP: 150/86 136/98  Pulse: 63 55  Temp: 97.6 F (36.4 C)   TempSrc: Oral   Resp: 18   Height: 5' 9.75" (1.772 m)   Weight: 192 lb (87.091 kg)   SpO2: 100% 98%    Medications  HYDROcodone-acetaminophen (NORCO/VICODIN) 5-325 MG per tablet 1 tablet (1 tablet Oral Given 12/04/14 1042)    Ghassan Fissel is a pleasant 72 y.o. male presenting with left flank pain status post slip and fall yesterday with no other traumas.  Case signed out to PA Gray at shift change: Plan is to follow-up rib series, urinalysis and DC home with narcotics for pain control.  Monico Blitz, PA-C 12/04/14 1213  Veryl Speak, MD 12/05/14 [phone]

## 2014-12-04 NOTE — Discharge Instructions (Signed)
Your x-rays were remarkable for a rib fracture your left 10th rib. Return to the ER if you develop any blood in your urine, difficulty urinating, severe abdominal pain, difficulty breathing, weakness or severe pain.   Rib Fracture A rib fracture is a break or crack in one of the bones of the ribs. The ribs are a group of long, curved bones that wrap around your chest and attach to your spine. They protect your lungs and other organs in the chest cavity. A broken or cracked rib is often painful, but most do not cause other problems. Most rib fractures heal on their own over time. However, rib fractures can be more serious if multiple ribs are broken or if broken ribs move out of place and push against other structures. CAUSES   A direct blow to the chest. For example, this could happen during contact sports, a car accident, or a fall against a hard object.  Repetitive movements with high force, such as pitching a baseball or having severe coughing spells. SYMPTOMS   Pain when you breathe in or cough.  Pain when someone presses on the injured area. DIAGNOSIS  Your caregiver will perform a physical exam. Various imaging tests may be ordered to confirm the diagnosis and to look for related injuries. These tests may include a chest X-ray, computed tomography (CT), magnetic resonance imaging (MRI), or a bone scan. TREATMENT  Rib fractures usually heal on their own in 1-3 months. The longer healing period is often associated with a continued cough or other aggravating activities. During the healing period, pain control is very important. Medication is usually given to control pain. Hospitalization or surgery may be needed for more severe injuries, such as those in which multiple ribs are broken or the ribs have moved out of place.  HOME CARE INSTRUCTIONS   Avoid strenuous activity and any activities or movements that cause pain. Be careful during activities and avoid bumping the injured  rib.  Gradually increase activity as directed by your caregiver.  Only take over-the-counter or prescription medications as directed by your caregiver. Do not take other medications without asking your caregiver first.  Apply ice to the injured area for the first 1-2 days after you have been treated or as directed by your caregiver. Applying ice helps to reduce inflammation and pain.  Put ice in a plastic bag.  Place a towel between your skin and the bag.   Leave the ice on for 15-20 minutes at a time, every 2 hours while you are awake.  Perform deep breathing as directed by your caregiver. This will help prevent pneumonia, which is a common complication of a broken rib. Your caregiver may instruct you to:  Take deep breaths several times a day.  Try to cough several times a day, holding a pillow against the injured area.  Use a device called an incentive spirometer to practice deep breathing several times a day.  Drink enough fluids to keep your urine clear or pale yellow. This will help you avoid constipation.   Do not wear a rib belt or binder. These restrict breathing, which can lead to pneumonia.  SEEK IMMEDIATE MEDICAL CARE IF:   You have a fever.   You have difficulty breathing or shortness of breath.   You develop a continual cough, or you cough up thick or bloody sputum.  You feel sick to your stomach (nausea), throw up (vomit), or have abdominal pain.   You have worsening pain not controlled with  medications.  MAKE SURE YOU:  Understand these instructions.  Will watch your condition.  Will get help right away if you are not doing well or get worse. Document Released: 11/30/2005 Document Revised: 08/02/2013 Document Reviewed: 02/01/2013 [facility] Patient Information 2015 [place], [place]. This information is not intended to replace advice given to you by your health care provider. Make sure you discuss any questions you have with your health care  provider.

## 2014-12-04 NOTE — ED Notes (Addendum)
Pt. [place] off of a stoop yesterday onto his lt. Side. Denies hitting head.  Lt. Lateral rib pain. No visible marks noted.  Pain with inspiration.  Denies any sob.  Pt. Has requested to sit in wheelchair.

## 2014-12-04 NOTE — ED Provider Notes (Signed)
   Patient is a 73 year old male complaining of left flank pain after a fall yesterday. Patient reporting his pain increased overnight, not relieved by naproxen, pain exacerbated with movement lying, and deep inspiration. Patient denies shortness of breath, dyspnea, head trauma, neck pain, abdominal pain, dizziness, weakness, blurred vision, headache, nausea, vomiting.   PE: Constitutional: well-developed, well-nourished, no apparent distress HENT: normocephalic, atraumatic Cardiovascular: normal rate and rhythm, distal pulses intact Pulmonary/Chest: effort normal; breath sounds clear and equal bilaterally; no wheezes or rales Abdominal: soft and nontender Musculoskeletal: full ROM, no edema. Mild to moderate tenderness of left flank region. No obvious deformity, C-spine tenderness, step offs. No ecchymosis, erythema, deformity, edema and left flank although there is point tenderness and left flank area of ribs 10 through 12. Lymphadenopathy: no cervical adenopathy Neurological: alert with goal directed thinking Skin: warm and dry, no rash, no diaphoresis Psychiatric: normal mood and affect, normal behavior   Patient's radiographs remarkable for a probable nondisplaced left lateral 10th rib fracture. No pneumothorax. Minimal linear atelectasis in left lung base. Patient's pain well controlled in the ER, patient up ad lib. in hallway, tolerating by mouth and stating he is feeling better. Patient states he has had a rib fracture in the past and is familiar with the course of therapy and follow-up. I strongly encouraged patient to follow up with his primary care physician regarding his rib fracture today. I discussed return precautions with patient, and discharged him with pain medication to help control his pain. Patient was agreeable with this plan. I encouraged patient to call or return to the ER if any worsening of symptoms or should he have any questions or concerns.  BP 135/79 mmHg  Pulse 60   Temp(Src) 97.6 F (36.4 C) (Oral)  Resp 18  Ht 5' 9.75" (1.772 m)  Wt 192 lb (87.091 kg)  BMI 27.74 kg/m2  SpO2 98%  Signed,  Dahlia Bailiff, PA-C 1:05 AM  Patient seen and discussed with Dr. Veryl Speak, M.D.  Carrie Mew, PA-C 12/05/14 0105  Veryl Speak, MD 12/05/14 [phone]

## 2014-12-04 NOTE — ED Notes (Signed)
Pt. Is aware of needing an urine specimen 

## 2016-09-27 IMAGING — CR DG RIBS W/ CHEST 3+V*L*
3 series · 3 of 3 positions shown · non-contrast
Comparison: Chest CT 01/26/2013

CLINICAL DATA: Pain left posterior lower lateral ribs. Fall off
steps yesterday.

EXAM:
LEFT RIBS AND CHEST - 3+ VIEW

[chest pa]
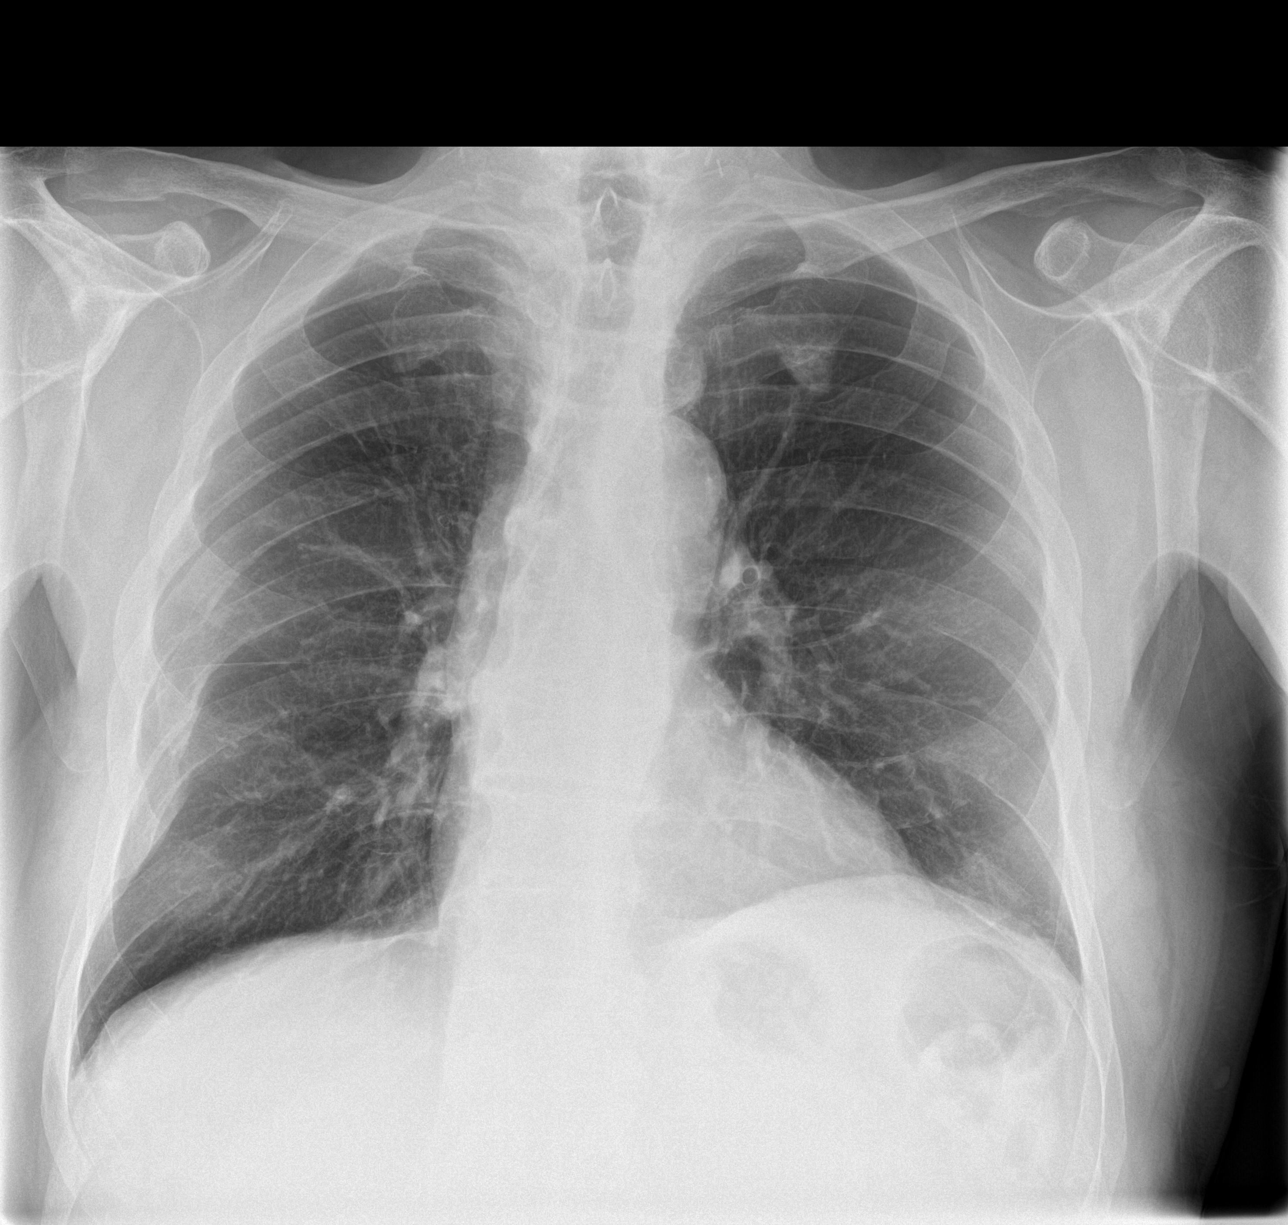

[rib ap]
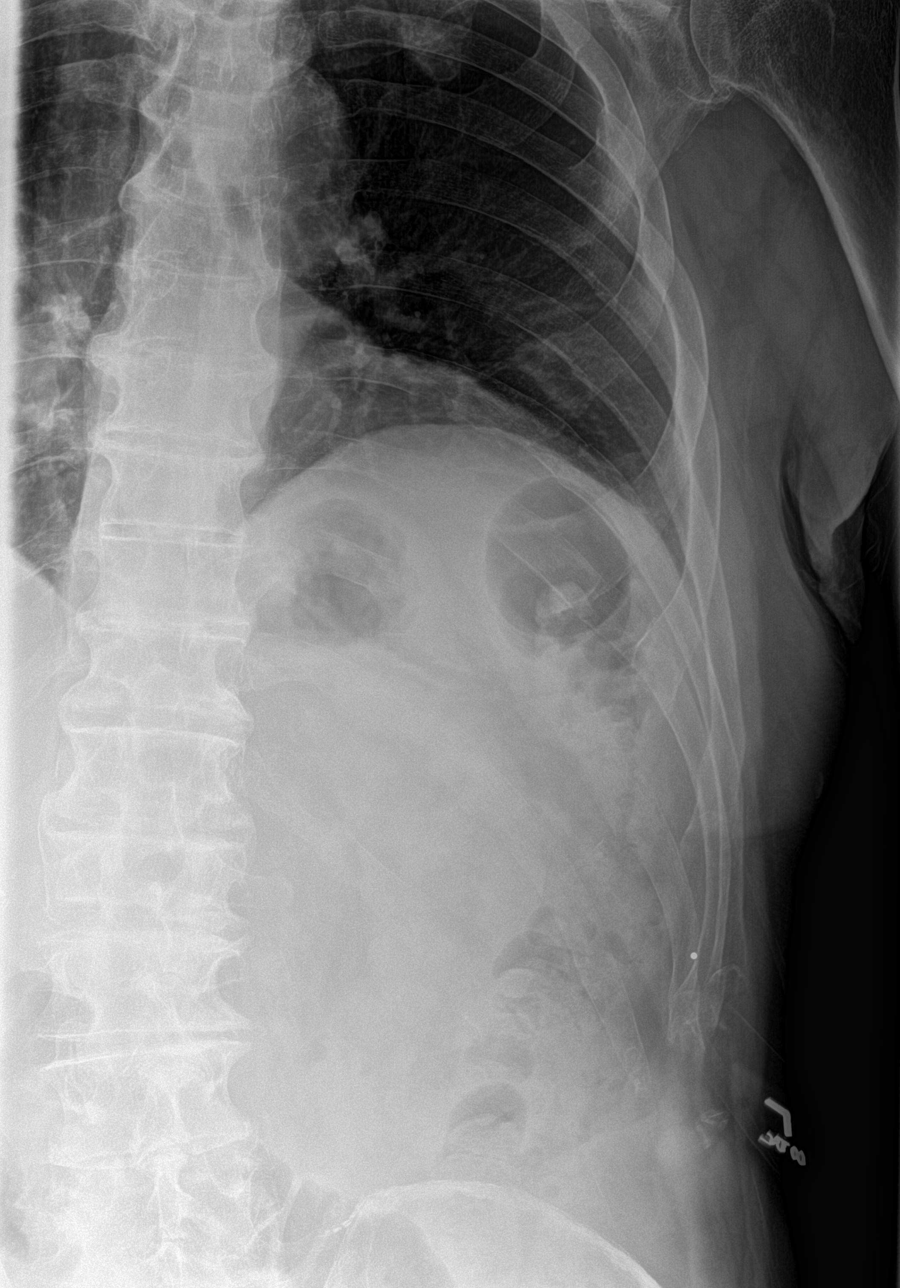

[rib ap obl]
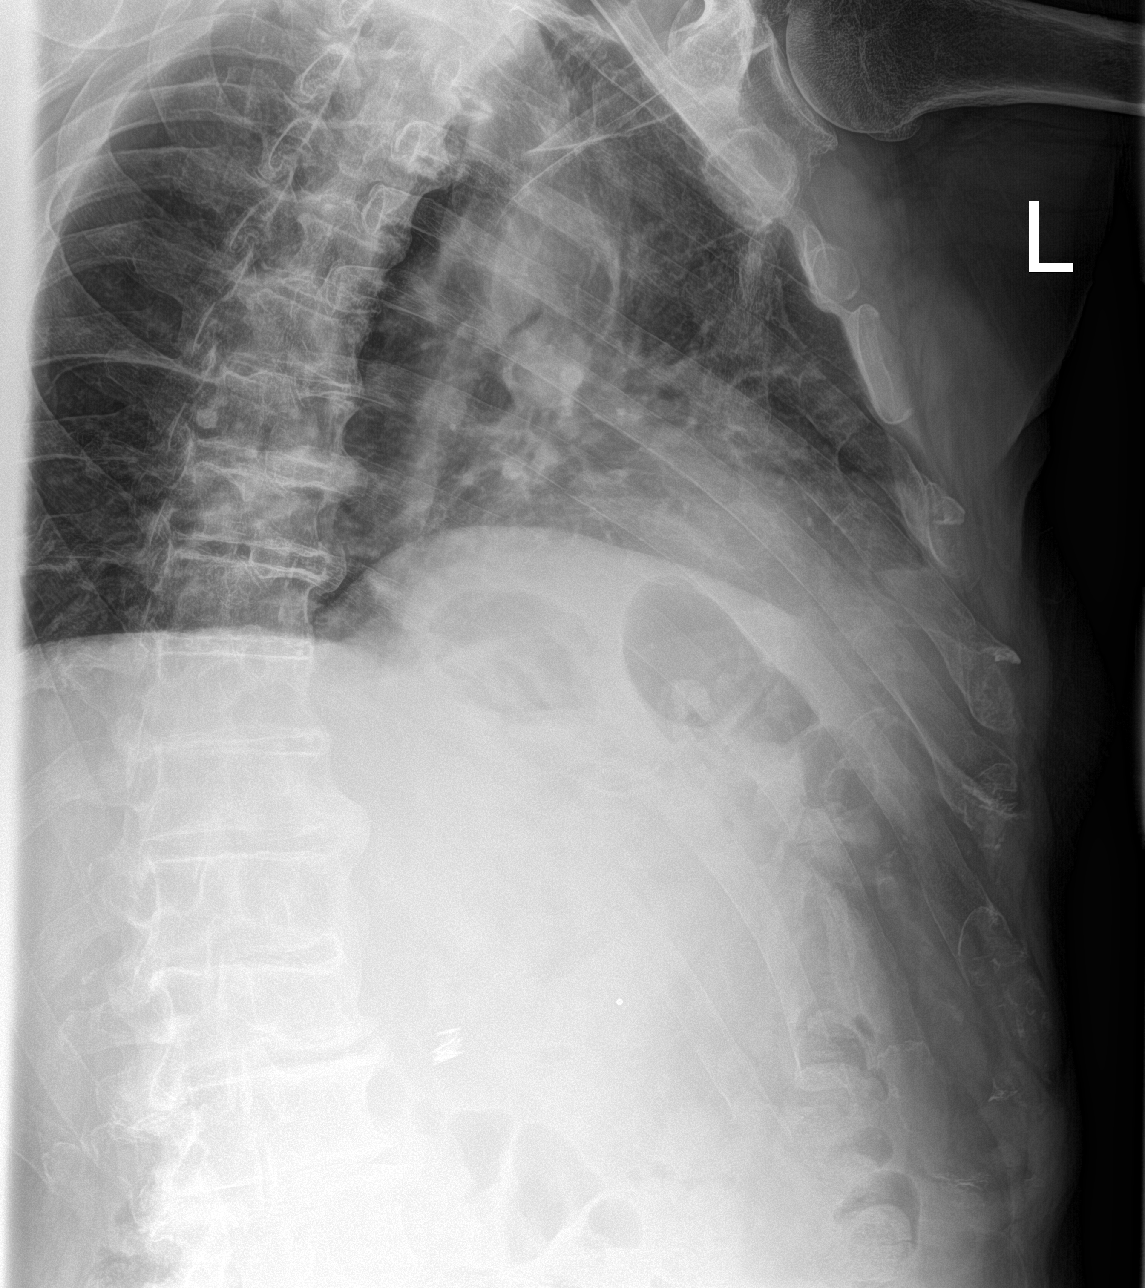

[3 of 3 positions shown; findings below may reference images not displayed]

FINDINGS: Probable nondisplaced left lateral tenth rib fracture, in the region
of the BB marked at site of pain. Minimal linear atelectasis at the
left lung base, the left lung is otherwise clear. There is no
pneumothorax or pleural effusion. Healed right-sided rib fractures
are seen. Cardiomediastinal contours are normal.
IMPRESSION: Probable nondisplaced left lateral tenth rib fracture. No
pneumothorax. Minimal linear atelectasis left lung base.
